# Patient Record
Sex: Male | Born: 1955 | Hispanic: Yes | Marital: Married | Smoking: Former smoker
Health system: Southern US, Community
[De-identification: ages and names within clinical notes are randomized; demographics above are authoritative.]

## PROBLEM LIST (undated history)

## (undated) DIAGNOSIS — I10 Essential (primary) hypertension: Secondary | ICD-10-CM

## (undated) DIAGNOSIS — E119 Type 2 diabetes mellitus without complications: Secondary | ICD-10-CM

## (undated) DIAGNOSIS — E785 Hyperlipidemia, unspecified: Secondary | ICD-10-CM

## (undated) HISTORY — PX: NO PAST SURGERIES: SHX2092

---

## 2012-12-15 ENCOUNTER — Encounter (HOSPITAL_COMMUNITY): Payer: Self-pay | Admitting: *Deleted

## 2012-12-15 ENCOUNTER — Observation Stay (HOSPITAL_COMMUNITY)
Admission: EM | Admit: 2012-12-15 | Discharge: 2012-12-18 | DRG: 184 | Disposition: A | Discharge status: Home / Self Care | Payer: No Typology Code available for payment source | Attending: General Surgery | Admitting: General Surgery

## 2012-12-15 ENCOUNTER — Emergency Department (HOSPITAL_COMMUNITY): Payer: No Typology Code available for payment source

## 2012-12-15 DIAGNOSIS — R000 Tachycardia, unspecified: Secondary | ICD-10-CM

## 2012-12-15 DIAGNOSIS — R079 Chest pain, unspecified: Secondary | ICD-10-CM | POA: Insufficient documentation

## 2012-12-15 DIAGNOSIS — R739 Hyperglycemia, unspecified: Secondary | ICD-10-CM

## 2012-12-15 DIAGNOSIS — I4811 Longstanding persistent atrial fibrillation: Secondary | ICD-10-CM

## 2012-12-15 DIAGNOSIS — E119 Type 2 diabetes mellitus without complications: Secondary | ICD-10-CM | POA: Insufficient documentation

## 2012-12-15 DIAGNOSIS — I4891 Unspecified atrial fibrillation: Secondary | ICD-10-CM

## 2012-12-15 DIAGNOSIS — I4892 Unspecified atrial flutter: Secondary | ICD-10-CM | POA: Insufficient documentation

## 2012-12-15 DIAGNOSIS — S20219A Contusion of unspecified front wall of thorax, initial encounter: Secondary | ICD-10-CM | POA: Insufficient documentation

## 2012-12-15 DIAGNOSIS — R748 Abnormal levels of other serum enzymes: Secondary | ICD-10-CM

## 2012-12-15 DIAGNOSIS — Z79899 Other long term (current) drug therapy: Secondary | ICD-10-CM | POA: Insufficient documentation

## 2012-12-15 DIAGNOSIS — S2220XA Unspecified fracture of sternum, initial encounter for closed fracture: Principal | ICD-10-CM | POA: Insufficient documentation

## 2012-12-15 DIAGNOSIS — W010XXA Fall on same level from slipping, tripping and stumbling without subsequent striking against object, initial encounter: Secondary | ICD-10-CM | POA: Insufficient documentation

## 2012-12-15 LAB — COMPREHENSIVE METABOLIC PANEL
Albumin: 4.3 g/dL (ref 3.5–5.2)
BUN: 21 mg/dL (ref 6–23)
Calcium: 9.3 mg/dL (ref 8.4–10.5)
Creatinine, Ser: 0.79 mg/dL (ref 0.50–1.35)
GFR calc Af Amer: >90 mL/min (ref >90)
Glucose, Bld: 267 mg/dL — ABNORMAL HIGH (ref 70–99)
Total Protein: 7.4 g/dL (ref 6.0–8.3)

## 2012-12-15 LAB — CBC WITH DIFFERENTIAL/PLATELET
Basophils Relative: 0 % (ref 0–1)
Eosinophils Absolute: 0 10*3/uL (ref 0.0–0.7)
Eosinophils Relative: 0 % (ref 0–5)
HCT: 42.5 % (ref 39.0–52.0)
Hemoglobin: 14.5 g/dL (ref 13.0–17.0)
Lymphs Abs: 0.9 10*3/uL (ref 0.7–4.0)
MCH: 28.5 pg (ref 26.0–34.0)
MCHC: 34.1 g/dL (ref 30.0–36.0)
MCV: 83.7 fL (ref 78.0–100.0)
Monocytes Absolute: 0.6 10*3/uL (ref 0.1–1.0)
Monocytes Relative: 5 % (ref 3–12)
Neutrophils Relative %: 89 % — ABNORMAL HIGH (ref 43–77)

## 2012-12-15 LAB — POCT I-STAT TROPONIN I: Troponin i, poc: 0 ng/mL (ref 0.00–0.08)

## 2012-12-15 NOTE — ED Notes (Signed)
MD at bedside. 

## 2012-12-15 NOTE — ED Notes (Signed)
Pt went to urgent care on [address] for CP. Pt was applied to EKG and the EKG at urgent care showed new onset of A-fib-a-flutter. Pt denies that feeling of palpations but states that the medication (he received 2 shots no records from urgent care other than [facility]) and the shots made him feel better. Pt not complaining of CP at this time. Pt denies SOB n/v

## 2012-12-15 NOTE — ED Provider Notes (Signed)
History     CSN: [id number]  Arrival date & time 12/15/12  2009   First MD Initiated Contact with Patient 12/15/12 2228      Chief Complaint  Patient presents with  . Chest Pain    (Consider location/radiation/quality/duration/timing/severity/associated sxs/prior treatment) HPI Comments: 57 y.o. Spanish-speaking male with no PMHx presents today complaining of chest pain s/p trauma. Pt states he was running to get out of the rain, tripped, and fell on a brick sticking up from the walk way, landing on his chest. Pt states the pain was severe and he was having pain on inspiration so he did go to an urgent care for treatment. Urgent care was concerned for what they saw as new onset a-flutter, however an EKG from 2006 shows a-flutter at that time as well. Pt states he was never made aware of the a-flutter, never saw a doctor, and was never medicated for it. Pain today is described as sudden, severe, localized, tender to palpation, worse with inhalation. Pt denies diaphoresis, nausea, vomiting, pain to back/jaw, visual disturbances.   Pt understands English, but Spanish interpreter was used to ensure understanding.   Severity: Moderate  Onset quality: acute s/p trauma Duration: since 11am Timing: Constant  Progression: improving Relieved by: rest Worsened by: inhalation, movement Ineffective treatments: None tried    Patient is a 57 y.o. male presenting with chest pain.  Chest Pain Associated symptoms: no abdominal pain, no cough, no diaphoresis, no dizziness, no fever, no headache, no nausea, no numbness, no palpitations, no shortness of breath, not vomiting and no weakness     History reviewed. No pertinent past medical history.  History reviewed. No pertinent past surgical history.  History reviewed. No pertinent family history.  History  Substance Use Topics  . Smoking status: Never Smoker   . Smokeless tobacco: Not on file  . Alcohol Use: Yes      Review of Systems    Constitutional: Negative for fever and diaphoresis.  HENT: Negative for neck pain and neck stiffness.   Eyes: Negative for visual disturbance.  Respiratory: Negative for apnea, cough, chest tightness and shortness of breath.   Cardiovascular: Positive for chest pain. Negative for palpitations.       Central, localized  Gastrointestinal: Negative for nausea, vomiting, abdominal pain, diarrhea and constipation.  Genitourinary: Negative for dysuria.  Musculoskeletal: Negative for gait problem.  Skin: Negative for rash.  Neurological: Negative for dizziness, weakness, light-headedness, numbness and headaches.    Allergies  Review of patient's allergies indicates no known allergies.  Home Medications  No current outpatient prescriptions on file.  BP 115/63  Pulse 123  Temp(Src) 98.1 F (36.7 C) (Oral)  Resp 19  Ht 5\' 8"  (1.727 m)  Wt 185 lb (83.915 kg)  BMI 28.14 kg/m2  SpO2 99%  Physical Exam  Nursing note and vitals reviewed. Constitutional: He is oriented to person, place, and time. He appears well-developed and well-nourished. No distress.  HENT:  Head: Normocephalic and atraumatic.  Eyes: Conjunctivae and EOM are normal.  Neck: Normal range of motion. Neck supple.  No meningeal signs  Cardiovascular: Normal rate and normal heart sounds.  Exam reveals no gallop and no friction rub.   No murmur heard. Tachycardic to 100s on exam  Pulmonary/Chest: No respiratory distress. He has no wheezes. He has no rales. He exhibits tenderness.  Breath sounds are shallow as pt finds painful to take a deep breath, equal bilateral expansion. Abrasion to central chest noted.   Abdominal: Soft. Bowel  sounds are normal. He exhibits no distension. There is no tenderness. There is no rebound and no guarding.  Musculoskeletal: Normal range of motion. He exhibits no edema and no tenderness.  FROM to upper and lower extremities  Neurological: He is alert and oriented to person, place, and time.  No cranial nerve deficit.  Speech is clear and goal oriented, follows commands Sensation normal to light touch Moves extremities without ataxia, coordination intact Normal gait and balance Normal strength in upper and lower extremities bilaterally including dorsiflexion and plantar flexion, strong and equal grip strength   Skin: Skin is warm and dry. He is not diaphoretic.     Abrasion noted to central chest s/p fall  Psychiatric: He has a normal mood and affect.    ED Course  Procedures (including critical care time)   Date: 12/16/2012  Rate: 110  Rhythm: atrial fibrillation  QRS Axis: left  Intervals: short R-R  ST/T Wave abnormalities: normal  Conduction Disutrbances:none  Narrative Interpretation: abnormal EKG  Old EKG Reviewed: changes noted 02/17/2005 notes a-flutter   Medications  oxyCODONE-acetaminophen (PERCOCET/ROXICET) 5-325 MG per tablet 2 tablet (not administered)  aspirin chewable tablet 324 mg (not administered)    Labs Reviewed  CBC WITH DIFFERENTIAL - Abnormal; Notable for the following:    WBC 13.9 (*)    Neutrophils Relative % 89 (*)    Neutro Abs 12.3 (*)    Lymphocytes Relative 7 (*)    All other components within normal limits  COMPREHENSIVE METABOLIC PANEL - Abnormal; Notable for the following:    Glucose, Bld 267 (*)    AST 89 (*)    ALT 82 (*)    All other components within normal limits  POCT I-STAT TROPONIN I   Dg Chest 2 View  12/15/2012   *RADIOLOGY REPORT*  Clinical Data: Chest pain, fell  CHEST - 2 VIEW  Comparison: None.  Findings: Slightly low inspiratory volumes with subsegmental bibasilar atelectasis.  Mild cardiomegaly.  No pleural effusion or pneumothorax.  On the lateral view, query nondisplaced fracture through the sternal body.  Central bronchitic changes.  IMPRESSION:  1.  Query nondisplaced sternal fracture on the lateral view. 2.  Cardiomegaly 3.  Low inspiratory volumes with mild bibasilar atelectasis 4.  Likely chronic  bronchitic changes.   Original Report Authenticated By: Malachy Moan, M.D.     1. Hyperglycemia   2. Elevated liver enzymes   3. A-fib   4. Tachycardia       MDM  Pt sent over from urgent care without current EKG. EKG that accompanied the pt is form 2006 and shows atrial flutter at that time. Pt is unaware of any underlying cardiac condition and is asymptomatic. Denies feeling of heart racing or previous cardiac incident/pain.  Risk factors for ACS are low: pt is not a smoker, not a drinker, has no known family hx of ACS, not a diabetic, no previous cardiac hx or hx of stroke. Will get labs, troponin, EKG, CXR.  CXR shows possible sternal fx. EKG shows a-fib. Troponin is negative. Will get CT with contrast for clarification and to r/o underlying pathology.  Cardiology consult appreciated to ask starting pt on aspirin vs coumadin. Cardiology agreed to start pt on aspirin and follow up with outpatient given low risk factors and pt.   Labs do show elevated blood glucose. While pt is not dx a diabetic, pt has not had routine medical follow up. Pt also has elevated AST/ALT though asymptomatic. Did discuss these findings with  pt with interpreter on the the phone to ensure pt understood importance of outpatient follow up. Pt and wife at bedside express understanding.   At sign-out, pt understands that he needs to see a cardiologist for treatment of what has become chronic a-fib/a-flutter. Pt understands that he will take aspirin daily until he is seen by cardiology and a treatment plan is in place. Pt understands that he is to fill the prescription for pain meds given to him by the urgent care and take as needed for pain. Pt understands he is waiting for a second set of imaging (the CT) to determine more definitively if his sternum is fractured and to ensure no underlying pathology.   1:17 AM Called CT to find out status. They are coming to transport pt, but there are 3 people ahead of the pt at  this time. Will sign pt out to Dr. Bebe Shaggy. If CT is negative for fx or additional pathology, can discharge as discussed with pt with follow up with cardiology and pain management.   Glade Nurse, [place] 12/16/12 [phone]

## 2012-12-16 ENCOUNTER — Encounter (HOSPITAL_COMMUNITY): Payer: Self-pay | Admitting: Radiology

## 2012-12-16 ENCOUNTER — Emergency Department (HOSPITAL_COMMUNITY): Payer: No Typology Code available for payment source

## 2012-12-16 DIAGNOSIS — E119 Type 2 diabetes mellitus without complications: Secondary | ICD-10-CM

## 2012-12-16 DIAGNOSIS — I4891 Unspecified atrial fibrillation: Secondary | ICD-10-CM

## 2012-12-16 DIAGNOSIS — S2220XA Unspecified fracture of sternum, initial encounter for closed fracture: Secondary | ICD-10-CM

## 2012-12-16 DIAGNOSIS — I4811 Longstanding persistent atrial fibrillation: Secondary | ICD-10-CM

## 2012-12-16 DIAGNOSIS — R7309 Other abnormal glucose: Secondary | ICD-10-CM

## 2012-12-16 LAB — PROTIME-INR: INR: 1.02 (ref 0.00–1.49)

## 2012-12-16 LAB — GLUCOSE, CAPILLARY: Glucose-Capillary: 164 mg/dL — ABNORMAL HIGH (ref 70–99)

## 2012-12-16 LAB — APTT: aPTT: 25 seconds (ref 24–37)

## 2012-12-16 MED ORDER — ENOXAPARIN SODIUM 40 MG/0.4ML ~~LOC~~ SOLN
40.0000 mg | Freq: Every day | SUBCUTANEOUS | Status: DC
  Administered 2012-12-16 – 2012-12-18 (×3): 40 mg via SUBCUTANEOUS
  Filled 2012-12-16 (×3): qty 0.4

## 2012-12-16 MED ORDER — HYDROCODONE-ACETAMINOPHEN 10-325 MG PO TABS: 0.5000 | ORAL_TABLET | ORAL | Status: DC | PRN

## 2012-12-16 MED ORDER — ONDANSETRON HCL 4 MG PO TABS: 4.0000 mg | ORAL_TABLET | Freq: Four times a day (QID) | ORAL | Status: DC | PRN

## 2012-12-16 MED ORDER — ONDANSETRON HCL 4 MG/2ML IJ SOLN: 4.0000 mg | Freq: Four times a day (QID) | INTRAMUSCULAR | Status: DC | PRN

## 2012-12-16 MED ORDER — METOPROLOL TARTRATE 12.5 MG HALF TABLET
12.5000 mg | ORAL_TABLET | Freq: Two times a day (BID) | ORAL | Status: DC
  Administered 2012-12-16 – 2012-12-18 (×4): 12.5 mg via ORAL
  Filled 2012-12-16 (×5): qty 1

## 2012-12-16 MED ORDER — SODIUM CHLORIDE 0.9 % IJ SOLN
3.0000 mL | Freq: Two times a day (BID) | INTRAMUSCULAR | Status: DC
  Administered 2012-12-16 – 2012-12-18 (×4): 3 mL via INTRAVENOUS

## 2012-12-16 MED ORDER — DOCUSATE SODIUM 100 MG PO CAPS
100.0000 mg | ORAL_CAPSULE | Freq: Two times a day (BID) | ORAL | Status: DC
  Administered 2012-12-16 – 2012-12-18 (×5): 100 mg via ORAL
  Filled 2012-12-16 (×6): qty 1

## 2012-12-16 MED ORDER — MORPHINE SULFATE 2 MG/ML IJ SOLN: 2.0000 mg | INTRAMUSCULAR | Status: DC | PRN

## 2012-12-16 MED ORDER — POLYETHYLENE GLYCOL 3350 17 G PO PACK
17.0000 g | PACK | Freq: Every day | ORAL | Status: DC
  Administered 2012-12-16 – 2012-12-18 (×3): 17 g via ORAL
  Filled 2012-12-16 (×3): qty 1

## 2012-12-16 MED ORDER — OXYCODONE-ACETAMINOPHEN 5-325 MG PO TABS
2.0000 | ORAL_TABLET | Freq: Once | ORAL | Status: AC
  Administered 2012-12-16: 2 via ORAL
  Filled 2012-12-16: qty 2

## 2012-12-16 MED ORDER — NAPROXEN 500 MG PO TABS
500.0000 mg | ORAL_TABLET | Freq: Two times a day (BID) | ORAL | Status: DC
  Administered 2012-12-16 – 2012-12-18 (×4): 500 mg via ORAL
  Filled 2012-12-16 (×6): qty 1

## 2012-12-16 MED ORDER — SODIUM CHLORIDE 0.9 % IV SOLN: 250.0000 mL | INTRAVENOUS | Status: DC | PRN

## 2012-12-16 MED ORDER — ASPIRIN 81 MG PO CHEW
324.0000 mg | CHEWABLE_TABLET | Freq: Once | ORAL | Status: AC
  Administered 2012-12-16: 324 mg via ORAL
  Filled 2012-12-16: qty 4

## 2012-12-16 MED ORDER — IOHEXOL 300 MG/ML  SOLN
100.0000 mL | Freq: Once | INTRAMUSCULAR | Status: AC | PRN
  Administered 2012-12-16: 100 mL via INTRAVENOUS

## 2012-12-16 MED ORDER — INSULIN ASPART 100 UNIT/ML ~~LOC~~ SOLN
0.0000 [IU] | Freq: Three times a day (TID) | SUBCUTANEOUS | Status: DC
  Administered 2012-12-17 – 2012-12-18 (×5): 3 [IU] via SUBCUTANEOUS

## 2012-12-16 MED ORDER — SODIUM CHLORIDE 0.9 % IJ SOLN: 3.0000 mL | INTRAMUSCULAR | Status: DC | PRN

## 2012-12-16 NOTE — Consult Note (Signed)
Reason for Consult: Afib Referring Physician:   Xzaiver Vayda is an 57 y.o. male.  HPI:   Patient is a 57 year old male with past medical history of a possible cardiac arrhythmia approximately 7 years ago.  He says he had seen his Dr. for cholesterol at that time and had put him on some sort of medicine.  Patient presented yesterday after he slipped going down some stairs and fell forward landing on his chest and hitting a brick that was protruding from a walkway.  He was noted to be in atrial fibrillation in the emergency room.  His daughter states that his heart rate was elevated approximately 2 weeks ago when she checked his pulse.  She did not notice that was irregular.  He currently complains of some mild chest discomfort and mild left wrist pain.   The patient currently denies nausea, vomiting, fever, shortness of breath, orthopnea, dizziness, PND, cough, congestion, abdominal pain, hematochezia, melena, lower extremity edema, claudication.    Social History:  reports that he has never smoked. He does not have any smokeless tobacco history on file. He reports that  drinks alcohol. He reports that he does not use illicit drugs.  Allergies: No Known Allergies  Medications:  Prior to Admission medications   Not on File     Results for orders placed during the hospital encounter of 12/15/12 (from the past 48 hour(s))  CBC WITH DIFFERENTIAL     Status: Abnormal   Collection Time    12/15/12  8:29 PM      Result Value Range   WBC 13.9 (*) 4.0 - 10.5 K/uL   RBC 5.08  4.22 - 5.81 MIL/uL   Hemoglobin 14.5  13.0 - 17.0 g/dL   HCT 29.5  62.1 - 30.8 %   MCV 83.7  78.0 - 100.0 fL   MCH 28.5  26.0 - 34.0 pg   MCHC 34.1  30.0 - 36.0 g/dL   RDW 65.7  84.6 - 96.2 %   Platelets 221  150 - 400 K/uL   Neutrophils Relative % 89 (*) 43 - 77 %   Neutro Abs 12.3 (*) 1.7 - 7.7 K/uL   Lymphocytes Relative 7 (*) 12 - 46 %   Lymphs Abs 0.9  0.7 - 4.0 K/uL   Monocytes Relative 5  3 - 12 %    Monocytes Absolute 0.6  0.1 - 1.0 K/uL   Eosinophils Relative 0  0 - 5 %   Eosinophils Absolute 0.0  0.0 - 0.7 K/uL   Basophils Relative 0  0 - 1 %   Basophils Absolute 0.0  0.0 - 0.1 K/uL  COMPREHENSIVE METABOLIC PANEL     Status: Abnormal   Collection Time    12/15/12  8:29 PM      Result Value Range   Sodium 138  135 - 145 mEq/L   Potassium 4.8  3.5 - 5.1 mEq/L   Chloride 103  96 - 112 mEq/L   CO2 25  19 - 32 mEq/L   Glucose, Bld 267 (*) 70 - 99 mg/dL   BUN 21  6 - 23 mg/dL   Creatinine, Ser 9.52  0.50 - 1.35 mg/dL   Calcium 9.3  8.4 - 84.1 mg/dL   Total Protein 7.4  6.0 - 8.3 g/dL   Albumin 4.3  3.5 - 5.2 g/dL   AST 89 (*) 0 - 37 U/L   ALT 82 (*) 0 - 53 U/L   Alkaline Phosphatase 98  39 - 117  U/L   Total Bilirubin 0.4  0.3 - 1.2 mg/dL   GFR calc non Af Amer >90  >90 mL/min   GFR calc Af Amer >90  >90 mL/min   Comment:            The eGFR has been calculated     using the CKD EPI equation.     This calculation has not been     validated in all clinical     situations.     eGFR's persistently     <90 mL/min signify     possible Chronic Kidney Disease.  POCT I-STAT TROPONIN I     Status: None   Collection Time    12/15/12  8:34 PM      Result Value Range   Troponin i, poc 0.00  0.00 - 0.08 ng/mL   Comment 3            Comment: Due to the release kinetics of cTnI,     a negative result within the first hours     of the onset of symptoms does not rule out     myocardial infarction with certainty.     If myocardial infarction is still suspected,     repeat the test at appropriate intervals.  PROTIME-INR     Status: None   Collection Time    12/16/12  3:48 AM      Result Value Range   Prothrombin Time 13.3  11.6 - 15.2 seconds   INR 1.02  0.00 - 1.49  APTT     Status: None   Collection Time    12/16/12  3:48 AM      Result Value Range   aPTT 25  24 - 37 seconds    Dg Chest 2 View  12/15/2012   *RADIOLOGY REPORT*  Clinical Data: Chest pain, fell  CHEST - 2 VIEW   Comparison: None.  Findings: Slightly low inspiratory volumes with subsegmental bibasilar atelectasis.  Mild cardiomegaly.  No pleural effusion or pneumothorax.  On the lateral view, query nondisplaced fracture through the sternal body.  Central bronchitic changes.  IMPRESSION:  1.  Query nondisplaced sternal fracture on the lateral view. 2.  Cardiomegaly 3.  Low inspiratory volumes with mild bibasilar atelectasis 4.  Likely chronic bronchitic changes.   Original Report Authenticated By: Malachy Moan, M.D.   Ct Chest W Contrast  12/16/2012   *RADIOLOGY REPORT*  Clinical Data: Chest pain.  Possible sternal fracture.  CT CHEST WITH CONTRAST  Technique:  Multidetector CT imaging of the chest was performed following the standard protocol during bolus administration of intravenous contrast.  Contrast: OMNIPAQUE IOHEXOL 300 MG/ML  SOLN  Comparison: Chest x-ray 12/15/2012.  Findings:  Mediastinum: There is a large amount of high attenuation fluid in the anterior mediastinum and deep to the known sternal fracture, compatible with hematoma.  This exerts mass effect upon the underlying heart and other adjacent structures.  This hematoma measures approximately 10.6 x 3.1 by 9.8 cm in greatest dimensions. No acute abnormality of the thoracic aorta; specifically, no aneurysm or dissection. Heart size is normal. No significant pericardial fluid, thickening or pericardial calcification. Borderline enlarged right paratracheal lymph nodes measuring 1 cm in short axis are nonspecific and may be reactive.  Esophagus is unremarkable in appearance.  Lungs/Pleura: Small bilateral pleural effusions (left greater than right).  Thickening of the peribronchovascular interstitium throughout the lower lobes of the lungs bilaterally (left greater than right) is nonspecific, but favored to represent  sequelae of recent aspiration.  No pneumothorax.  Upper Abdomen: Unremarkable.  Musculoskeletal: Minimally displaced fracture of the  sternum approximately 2 cm beneath the sternomanubrial joint is noted.  Old healed fractures of the left ninth, tenth and eleventh ribs posteriorly are noted. There are no aggressive appearing lytic or blastic lesions noted in the visualized portions of the skeleton.  IMPRESSION: 1.  Minimally displaced sternal fracture with large anterior mediastinal hematoma, as above. 2.  No definite vascular injury in the mediastinum (i.e., the blood does not appear to be related to an acute aortic or major venous injury). 3.  Findings in the lower lobes of the lungs suspicious for aspiration pneumonitis with small bilateral pleural effusions (left greater than right).   Original Report Authenticated By: Trudie Reed, M.D.    Review of Systems  Constitutional: Negative for fever.  HENT: Negative for congestion.   Respiratory: Negative for cough and shortness of breath.   Cardiovascular: Positive for chest pain (Mild). Negative for palpitations, orthopnea, leg swelling and PND.  Gastrointestinal: Negative for nausea, vomiting, abdominal pain, blood in stool and melena.  Genitourinary: Negative for dysuria and hematuria.  Musculoskeletal: Positive for joint pain (Left wrist).  Neurological: Negative for dizziness and weakness.  All other systems reviewed and are negative.   Blood pressure 106/76, pulse 96, temperature 98.5 F (36.9 C), temperature source Oral, resp. rate 18, height 5\' 8"  (1.727 m), weight 178 lb 12.7 oz (81.1 kg), SpO2 99.00%. Physical Exam  Constitutional: He is oriented to person, place, and time. He appears well-developed and well-nourished. No distress.  HENT:  Head: Normocephalic and atraumatic.  Eyes: EOM are normal. Pupils are equal, round, and reactive to light. No scleral icterus.  Neck: Normal range of motion. Neck supple.  Cardiovascular: S1 normal and S2 normal.  An irregularly irregular rhythm present. Tachycardia present.   No murmur heard. Pulses:      Radial pulses are  2+ on the right side, and 2+ on the left side.       Dorsalis pedis pulses are 2+ on the right side, and 2+ on the left side.  Respiratory: Effort normal and breath sounds normal. No respiratory distress. He has no wheezes. He has no rales. He exhibits tenderness.  GI: Soft. Bowel sounds are normal. He exhibits no distension. There is no tenderness.  Musculoskeletal: He exhibits no edema.  Lymphadenopathy:    He has no cervical adenopathy.  Neurological: He is alert and oriented to person, place, and time. He exhibits normal muscle tone.  Skin: Skin is warm and dry.  Psychiatric: He has a normal mood and affect.    Assessment/Plan:    Active Problems:   Atrial fibrillation with rapid ventricular response   Sternal fracture with retrosternal hematoma.  Plan:  Start low dose lopressor and get 2d echo.  Continue ASA.  Hold off on anticoagulation due to trauma.  Will add CBG checks and obtain A1C .  HAGER, BRYAN 12/16/2012, 6:05 PM      I have seen and examined the patient along with Wilburt Finlay, PA.  I have reviewed the chart, notes and new data.  I agree with PA's note.  Key new complaints: feels great - no pain, no palpitations Key examination changes: tiny ecchymosis presternally Key new findings / data: large anterior mediastinal hematoma and nondisplaced sternal fracture on CT chest; no evidence of significant compression of cardiovascular structures or pericardial fluid  PLAN: While it is tempting to attribute his arrhythmia to cardiac contusion,  it sounds like this a older problem. His daughter has noted occasional tachycardia when she checks his BP. He saw a physician 7-8 years ago for an irregular heart beat (Dr. Jonny Ruiz, MD. [address], [place], [place] [address] )     He denies DM, HTN, CHF, CVA/TIA. That would make his CHADS2 score zero, but it appears likely he has DM. ASA would still be the appropriate embolism prevention agent and full  anticoagulation is out of the question until his injury heals.  Check echo. AV blockade for rate control. Aspirin.  Thurmon Fair, MD, [facility] [phone] 12/16/2012, 6:14 PM

## 2012-12-16 NOTE — ED Notes (Signed)
MD at bedside. 

## 2012-12-16 NOTE — Progress Notes (Signed)
Dr. Janee Morn notified of patient's arrival to floor. Donald Chang

## 2012-12-16 NOTE — Progress Notes (Signed)
Patient received from ED. Assessed per flow sheet. Denies chest pain or shortness of breath. VSS. Call bell near.Donald Chang

## 2012-12-16 NOTE — Progress Notes (Signed)
PT Cancellation Note  Patient Details Name: Donald Chang MRN: [id number] DOB: 06-10-1956   Cancelled Treatment:    Reason Eval/Treat Not Completed: Patient not medically ready;Other (comment) (Hold per RN).  Per RN pt has a "big thrombus" I believe this is referring to the sternal fx and large hematoma found on CT scan.  Pt is in afib/flutter and resting HR is 123.  Pt has order for bedrest and activity as tolerated.  Pt will need to be off of bedrest for PT to work with him.  PT to check back tomorrow.     Rollene Rotunda Foy Vanduyne, PT, DPT [phone]   12/16/2012, 2:13 PM

## 2012-12-16 NOTE — ED Provider Notes (Signed)
I spoke to dr Vonna Kotyk wyatt with trauma He saw patient and reviewed CT chest He feels with hematoma as well as aflutter he should be admitted Will admit to trauma service for monitoring for OBS  Joya Gaskins, MD 12/16/12 [phone]

## 2012-12-16 NOTE — ED Notes (Signed)
Family at bedside. 

## 2012-12-16 NOTE — H&P (Signed)
Donald Chang is an 57 y.o. male.   Chief Complaint: The patient came to the ED from Urgent Care center after being seen after a fall with chest pain. HPI: The patient was running in the rain when he fell, hitting his chest on a brick wall, partially bracing himself with his arms, scraping his wrists, but without bony upper extremity pain.  He did not hit his head.  There was no loss of consciousness.  He has a remote history of a cardiac arrhythmia, the details of which the patient was very vague.  He has not received any specific treatment that he recalls.  He does speak [place], but a bit broken, and he seems to have some difficulty understanding some words.  Although he had no LOC, he did have his "wind knocked out".  I was asked to see the patient because of a sternal fracture and a substernal hematoma.  History reviewed. No pertinent past medical history.  History reviewed. No pertinent past surgical history.  History reviewed. No pertinent family history. Social History:  reports that he has never smoked. He does not have any smokeless tobacco history on file. He reports that  drinks alcohol. He reports that he does not use illicit drugs.  Allergies: No Known Allergies  No prescriptions prior to admission    Results for orders placed during the hospital encounter of 12/15/12 (from the past 48 hour(s))  CBC WITH DIFFERENTIAL     Status: Abnormal   Collection Time    12/15/12  8:29 PM      Result Value Range   WBC 13.9 (*) 4.0 - 10.5 K/uL   RBC 5.08  4.22 - 5.81 MIL/uL   Hemoglobin 14.5  13.0 - 17.0 g/dL   HCT 09.8  11.9 - 14.7 %   MCV 83.7  78.0 - 100.0 fL   MCH 28.5  26.0 - 34.0 pg   MCHC 34.1  30.0 - 36.0 g/dL   RDW 82.9  56.2 - 13.0 %   Platelets 221  150 - 400 K/uL   Neutrophils Relative % 89 (*) 43 - 77 %   Neutro Abs 12.3 (*) 1.7 - 7.7 K/uL   Lymphocytes Relative 7 (*) 12 - 46 %   Lymphs Abs 0.9  0.7 - 4.0 K/uL   Monocytes Relative 5  3 - 12 %   Monocytes Absolute  0.6  0.1 - 1.0 K/uL   Eosinophils Relative 0  0 - 5 %   Eosinophils Absolute 0.0  0.0 - 0.7 K/uL   Basophils Relative 0  0 - 1 %   Basophils Absolute 0.0  0.0 - 0.1 K/uL  COMPREHENSIVE METABOLIC PANEL     Status: Abnormal   Collection Time    12/15/12  8:29 PM      Result Value Range   Sodium 138  135 - 145 mEq/L   Potassium 4.8  3.5 - 5.1 mEq/L   Chloride 103  96 - 112 mEq/L   CO2 25  19 - 32 mEq/L   Glucose, Bld 267 (*) 70 - 99 mg/dL   BUN 21  6 - 23 mg/dL   Creatinine, Ser 8.65  0.50 - 1.35 mg/dL   Calcium 9.3  8.4 - 78.4 mg/dL   Total Protein 7.4  6.0 - 8.3 g/dL   Albumin 4.3  3.5 - 5.2 g/dL   AST 89 (*) 0 - 37 U/L   ALT 82 (*) 0 - 53 U/L   Alkaline Phosphatase 98  39 - 117 U/L   Total Bilirubin 0.4  0.3 - 1.2 mg/dL   GFR calc non Af Amer >90  >90 mL/min   GFR calc Af Amer >90  >90 mL/min   Comment:            The eGFR has been calculated     using the CKD EPI equation.     This calculation has not been     validated in all clinical     situations.     eGFR's persistently     <90 mL/min signify     possible Chronic Kidney Disease.  POCT I-STAT TROPONIN I     Status: None   Collection Time    12/15/12  8:34 PM      Result Value Range   Troponin i, poc 0.00  0.00 - 0.08 ng/mL   Comment 3            Comment: Due to the release kinetics of cTnI,     a negative result within the first hours     of the onset of symptoms does not rule out     myocardial infarction with certainty.     If myocardial infarction is still suspected,     repeat the test at appropriate intervals.  PROTIME-INR     Status: None   Collection Time    12/16/12  3:48 AM      Result Value Range   Prothrombin Time 13.3  11.6 - 15.2 seconds   INR 1.02  0.00 - 1.49  APTT     Status: None   Collection Time    12/16/12  3:48 AM      Result Value Range   aPTT 25  24 - 37 seconds   Dg Chest 2 View  12/15/2012   *RADIOLOGY REPORT*  Clinical Data: Chest pain, fell  CHEST - 2 VIEW  Comparison: None.   Findings: Slightly low inspiratory volumes with subsegmental bibasilar atelectasis.  Mild cardiomegaly.  No pleural effusion or pneumothorax.  On the lateral view, query nondisplaced fracture through the sternal body.  Central bronchitic changes.  IMPRESSION:  1.  Query nondisplaced sternal fracture on the lateral view. 2.  Cardiomegaly 3.  Low inspiratory volumes with mild bibasilar atelectasis 4.  Likely chronic bronchitic changes.   Original Report Authenticated By: Malachy Moan, M.D.   Ct Chest W Contrast  12/16/2012   *RADIOLOGY REPORT*  Clinical Data: Chest pain.  Possible sternal fracture.  CT CHEST WITH CONTRAST  Technique:  Multidetector CT imaging of the chest was performed following the standard protocol during bolus administration of intravenous contrast.  Contrast: OMNIPAQUE IOHEXOL 300 MG/ML  SOLN  Comparison: Chest x-ray 12/15/2012.  Findings:  Mediastinum: There is a large amount of high attenuation fluid in the anterior mediastinum and deep to the known sternal fracture, compatible with hematoma.  This exerts mass effect upon the underlying heart and other adjacent structures.  This hematoma measures approximately 10.6 x 3.1 by 9.8 cm in greatest dimensions. No acute abnormality of the thoracic aorta; specifically, no aneurysm or dissection. Heart size is normal. No significant pericardial fluid, thickening or pericardial calcification. Borderline enlarged right paratracheal lymph nodes measuring 1 cm in short axis are nonspecific and may be reactive.  Esophagus is unremarkable in appearance.  Lungs/Pleura: Small bilateral pleural effusions (left greater than right).  Thickening of the peribronchovascular interstitium throughout the lower lobes of the lungs bilaterally (left greater than right) is nonspecific, but favored  to represent sequelae of recent aspiration.  No pneumothorax.  Upper Abdomen: Unremarkable.  Musculoskeletal: Minimally displaced fracture of the sternum approximately  2 cm beneath the sternomanubrial joint is noted.  Old healed fractures of the left ninth, tenth and eleventh ribs posteriorly are noted. There are no aggressive appearing lytic or blastic lesions noted in the visualized portions of the skeleton.  IMPRESSION: 1.  Minimally displaced sternal fracture with large anterior mediastinal hematoma, as above. 2.  No definite vascular injury in the mediastinum (i.e., the blood does not appear to be related to an acute aortic or major venous injury). 3.  Findings in the lower lobes of the lungs suspicious for aspiration pneumonitis with small bilateral pleural effusions (left greater than right).   Original Report Authenticated By: Trudie Reed, M.D.    ROS  Blood pressure 106/76, pulse 96, temperature 98.5 F (36.9 C), temperature source Oral, resp. rate 18, height 5\' 8"  (1.727 m), weight 81.1 kg (178 lb 12.7 oz), SpO2 99.00%. Physical Exam  Constitutional: He is oriented to person, place, and time. He appears well-developed and well-nourished.  HENT:  Head: Normocephalic and atraumatic.  Eyes: Conjunctivae and EOM are normal. Pupils are equal, round, and reactive to light.  Neck: Normal range of motion. Neck supple.  Respiratory: Effort normal and breath sounds normal. He exhibits tenderness and bony tenderness. He exhibits no crepitus and no deformity.    GI: Soft. Bowel sounds are normal. There is no tenderness.  Musculoskeletal: Normal range of motion.  Neurological: He is alert and oriented to person, place, and time. He has normal reflexes.  Skin: Skin is warm and dry.  Psychiatric: He has a normal mood and affect. His behavior is normal. Judgment and thought content normal.     Assessment/Plan Ground level fall in the rain. Mildly displaced mid-body sternal fracture with retrosternal hematoma. Variable atrial flutter/fibrillation with controlled ventricular rate. Question as to whether the atrial arrhythmia is chronic or induced by the fall  and sternal fracture.  Although the patient is minimally symptomatic, I do not feel comfortable sending this patient home with what would seem to be a blunt cardiac injury.  He should be monitored, and cardiology should see the patient in the hospital as opposed to seeing her as an outpatient an aspirin therapy.  No specific treatment planned outside of pain management at this point.  Cherylynn Ridges 12/16/2012, 5:24 PM

## 2012-12-16 NOTE — ED Notes (Signed)
Patient transported to CT 

## 2012-12-17 LAB — GLUCOSE, CAPILLARY
Glucose-Capillary: 185 mg/dL — ABNORMAL HIGH (ref 70–99)
Glucose-Capillary: 188 mg/dL — ABNORMAL HIGH (ref 70–99)

## 2012-12-17 MED ORDER — METFORMIN HCL 500 MG PO TABS
500.0000 mg | ORAL_TABLET | Freq: Two times a day (BID) | ORAL | Status: DC
  Administered 2012-12-17 – 2012-12-18 (×2): 500 mg via ORAL
  Filled 2012-12-17 (×4): qty 1

## 2012-12-17 NOTE — Progress Notes (Addendum)
[facility]  DAILY PROGRESS NOTE   Subjective:  He feels well. Remains in AF, well controlled rate. Echo not yet done  Objective:  Temp:  [98.3 F (36.8 C)-99.1 F (37.3 C)] 98.3 F (36.8 C) (06/21 0456) Pulse Rate:  [78-97] 78 (06/21 0456) Resp:  [17-20] 20 (06/21 0456) BP: (106-123)/(63-86) 115/63 mmHg (06/21 0456) SpO2:  [98 %-99 %] 98 % (06/21 0456) Weight:  [80.7 kg (177 lb 14.6 oz)-81.1 kg (178 lb 12.7 oz)] 81.1 kg (178 lb 12.7 oz) (06/20 0957) Weight change: -3.215 kg (-7 lb 1.4 oz)  Intake/Output from previous day: 06/20 0701 - 06/21 0700 In: 480 [P.O.:480] Out: -   Intake/Output from this shift:    Medications: Current Facility-Administered Medications  Medication Dose Route Frequency Provider Last Rate Last Dose  . 0.9 %  sodium chloride infusion  250 mL Intravenous PRN Freeman Caldron, PA-C      . docusate sodium (COLACE) capsule 100 mg  100 mg Oral BID Freeman Caldron, PA-C   100 mg at 12/16/12 2144  . enoxaparin (LOVENOX) injection 40 mg  40 mg Subcutaneous Daily Freeman Caldron, PA-C   40 mg at 12/16/12 1208  . HYDROcodone-acetaminophen (NORCO) 10-325 MG per tablet 0.5-2 tablet  0.5-2 tablet Oral Q4H PRN Freeman Caldron, PA-C      . insulin aspart (novoLOG) injection 0-15 Units  0-15 Units Subcutaneous TID WC Wilburt Finlay, PA-C   3 Units at 12/17/12 0650  . metoprolol tartrate (LOPRESSOR) tablet 12.5 mg  12.5 mg Oral BID Wilburt Finlay, PA-C   12.5 mg at 12/16/12 1900  . morphine 2 MG/ML injection 2 mg  2 mg Intravenous Q4H PRN Freeman Caldron, PA-C      . naproxen (NAPROSYN) tablet 500 mg  500 mg Oral BID WC Freeman Caldron, PA-C   500 mg at 12/17/12 0800  . ondansetron (ZOFRAN) tablet 4 mg  4 mg Oral Q6H PRN Freeman Caldron, PA-C       Or  . ondansetron [facility]) injection 4 mg  4 mg Intravenous Q6H PRN Freeman Caldron, PA-C      . polyethylene glycol (MIRALAX / GLYCOLAX) packet 17 g  17 g Oral Daily Freeman Caldron, PA-C   17 g at 12/16/12 1209  . sodium chloride 0.9 % injection 3 mL  3 mL Intravenous Q12H Freeman Caldron, PA-C   3 mL at 12/16/12 2144  . sodium chloride 0.9 % injection 3 mL  3 mL Intravenous PRN Freeman Caldron, PA-C        Physical Exam: Constitutional: He is oriented to person, place, and time. He appears well-developed and well-nourished. No distress.  HENT:  Head: Normocephalic and atraumatic.  Eyes: EOM are normal. Pupils are equal, round, and reactive to light. No scleral icterus.  Neck: Normal range of motion. Neck supple.  Cardiovascular: S1 normal and S2 normal. An irregularly irregular rhythm present. Tachycardia present.  No murmur heard.  Pulses:  Radial pulses are 2+ on the right side, and 2+ on the left side.  Dorsalis pedis pulses are 2+ on the right side, and 2+ on the left side.  Respiratory: Effort normal and breath sounds normal. No respiratory distress. He has no wheezes. He has no rales. He exhibits tenderness.  GI: Soft. Bowel sounds are normal. He exhibits no distension. There is no tenderness.  Musculoskeletal: He exhibits no edema.  Lymphadenopathy:  He has no cervical adenopathy.  Neurological:  He is alert and oriented to person, place, and time. He exhibits normal muscle tone.  Skin: Skin is warm and dry.  Psychiatric: He has a normal mood and affect.       Lab Results: Results for orders placed during the hospital encounter of 12/15/12 (from the past 48 hour(s))  CBC WITH DIFFERENTIAL     Status: Abnormal   Collection Time    12/15/12  8:29 PM      Result Value Range   WBC 13.9 (*) 4.0 - 10.5 K/uL   RBC 5.08  4.22 - 5.81 MIL/uL   Hemoglobin 14.5  13.0 - 17.0 g/dL   HCT 52.8  41.3 - 24.4 %   MCV 83.7  78.0 - 100.0 fL   MCH 28.5  26.0 - 34.0 pg   MCHC 34.1  30.0 - 36.0 g/dL   RDW 01.0  27.2 - 53.6 %   Platelets 221  150 - 400 K/uL   Neutrophils Relative % 89 (*) 43 - 77 %   Neutro Abs 12.3 (*) 1.7 - 7.7 K/uL   Lymphocytes  Relative 7 (*) 12 - 46 %   Lymphs Abs 0.9  0.7 - 4.0 K/uL   Monocytes Relative 5  3 - 12 %   Monocytes Absolute 0.6  0.1 - 1.0 K/uL   Eosinophils Relative 0  0 - 5 %   Eosinophils Absolute 0.0  0.0 - 0.7 K/uL   Basophils Relative 0  0 - 1 %   Basophils Absolute 0.0  0.0 - 0.1 K/uL  COMPREHENSIVE METABOLIC PANEL     Status: Abnormal   Collection Time    12/15/12  8:29 PM      Result Value Range   Sodium 138  135 - 145 mEq/L   Potassium 4.8  3.5 - 5.1 mEq/L   Chloride 103  96 - 112 mEq/L   CO2 25  19 - 32 mEq/L   Glucose, Bld 267 (*) 70 - 99 mg/dL   BUN 21  6 - 23 mg/dL   Creatinine, Ser 6.44  0.50 - 1.35 mg/dL   Calcium 9.3  8.4 - 03.4 mg/dL   Total Protein 7.4  6.0 - 8.3 g/dL   Albumin 4.3  3.5 - 5.2 g/dL   AST 89 (*) 0 - 37 U/L   ALT 82 (*) 0 - 53 U/L   Alkaline Phosphatase 98  39 - 117 U/L   Total Bilirubin 0.4  0.3 - 1.2 mg/dL   GFR calc non Af Amer >90  >90 mL/min   GFR calc Af Amer >90  >90 mL/min   Comment:            The eGFR has been calculated     using the CKD EPI equation.     This calculation has not been     validated in all clinical     situations.     eGFR's persistently     <90 mL/min signify     possible Chronic Kidney Disease.  POCT I-STAT TROPONIN I     Status: None   Collection Time    12/15/12  8:34 PM      Result Value Range   Troponin i, poc 0.00  0.00 - 0.08 ng/mL   Comment 3            Comment: Due to the release kinetics of cTnI,     a negative result within the first hours     of the onset  of symptoms does not rule out     myocardial infarction with certainty.     If myocardial infarction is still suspected,     repeat the test at appropriate intervals.  PROTIME-INR     Status: None   Collection Time    12/16/12  3:48 AM      Result Value Range   Prothrombin Time 13.3  11.6 - 15.2 seconds   INR 1.02  0.00 - 1.49  APTT     Status: None   Collection Time    12/16/12  3:48 AM      Result Value Range   aPTT 25  24 - 37 seconds   HEMOGLOBIN A1C     Status: Abnormal   Collection Time    12/16/12  7:46 PM      Result Value Range   Hemoglobin A1C 8.2 (*) <5.7 %   Comment: (NOTE)                                                                               According to the ADA Clinical Practice Recommendations for 2011, when     HbA1c is used as a screening test:      >=6.5%   Diagnostic of Diabetes Mellitus               (if abnormal result is confirmed)     5.7-6.4%   Increased risk of developing Diabetes Mellitus     References:Diagnosis and Classification of Diabetes Mellitus,Diabetes     Care,2011,34(Suppl 1):S62-S69 and Standards of Medical Care in             Diabetes - 2011,Diabetes Care,2011,34 (Suppl 1):S11-S61.   Mean Plasma Glucose 189 (*) <117 mg/dL  GLUCOSE, CAPILLARY     Status: Abnormal   Collection Time    12/16/12  8:43 PM      Result Value Range   Glucose-Capillary 164 (*) 70 - 99 mg/dL  GLUCOSE, CAPILLARY     Status: Abnormal   Collection Time    12/17/12  6:14 AM      Result Value Range   Glucose-Capillary 185 (*) 70 - 99 mg/dL    Imaging: Dg Chest 2 View  12/15/2012   *RADIOLOGY REPORT*  Clinical Data: Chest pain, fell  CHEST - 2 VIEW  Comparison: None.  Findings: Slightly low inspiratory volumes with subsegmental bibasilar atelectasis.  Mild cardiomegaly.  No pleural effusion or pneumothorax.  On the lateral view, query nondisplaced fracture through the sternal body.  Central bronchitic changes.  IMPRESSION:  1.  Query nondisplaced sternal fracture on the lateral view. 2.  Cardiomegaly 3.  Low inspiratory volumes with mild bibasilar atelectasis 4.  Likely chronic bronchitic changes.   Original Report Authenticated By: Malachy Moan, M.D.   Ct Chest W Contrast  12/16/2012   *RADIOLOGY REPORT*  Clinical Data: Chest pain.  Possible sternal fracture.  CT CHEST WITH CONTRAST  Technique:  Multidetector CT imaging of the chest was performed following the standard protocol during bolus  administration of intravenous contrast.  Contrast: OMNIPAQUE IOHEXOL 300 MG/ML  SOLN  Comparison: Chest x-ray 12/15/2012.  Findings:  Mediastinum: There is a large amount of high attenuation fluid  in the anterior mediastinum and deep to the known sternal fracture, compatible with hematoma.  This exerts mass effect upon the underlying heart and other adjacent structures.  This hematoma measures approximately 10.6 x 3.1 by 9.8 cm in greatest dimensions. No acute abnormality of the thoracic aorta; specifically, no aneurysm or dissection. Heart size is normal. No significant pericardial fluid, thickening or pericardial calcification. Borderline enlarged right paratracheal lymph nodes measuring 1 cm in short axis are nonspecific and may be reactive.  Esophagus is unremarkable in appearance.  Lungs/Pleura: Small bilateral pleural effusions (left greater than right).  Thickening of the peribronchovascular interstitium throughout the lower lobes of the lungs bilaterally (left greater than right) is nonspecific, but favored to represent sequelae of recent aspiration.  No pneumothorax.  Upper Abdomen: Unremarkable.  Musculoskeletal: Minimally displaced fracture of the sternum approximately 2 cm beneath the sternomanubrial joint is noted.  Old healed fractures of the left ninth, tenth and eleventh ribs posteriorly are noted. There are no aggressive appearing lytic or blastic lesions noted in the visualized portions of the skeleton.  IMPRESSION: 1.  Minimally displaced sternal fracture with large anterior mediastinal hematoma, as above. 2.  No definite vascular injury in the mediastinum (i.e., the blood does not appear to be related to an acute aortic or major venous injury). 3.  Findings in the lower lobes of the lungs suspicious for aspiration pneumonitis with small bilateral pleural effusions (left greater than right).   Original Report Authenticated By: Trudie Reed, M.D.    Assessment:  1. Active  Problems: 2.   Atrial fibrillation with rapid ventricular response 3.   Plan:  1. Aspirin 2. Review echo 3. Good rate control without meds increases suspicion that he has chronic conduction system disease and that this may be permanent atrial fibrillation, unrelated to chest trauma. 4. Initiate metformin for DM, will arrange outpatient diabetes education/nutrition consult, but he needs a primary care physician  Time Spent Directly with Patient:  20 minutes  Length of Stay:  LOS: 2 days    Eder Macek 12/17/2012, 9:34 AM

## 2012-12-17 NOTE — Progress Notes (Signed)
OT Cancellation/screen Note  Patient Details Name: [name] MRN: [id number] DOB: Dec 03, 1955   Cancelled Treatment:    Reason Eval/Treat Not Completed: Other (comment) Checked with pt.  He feels like he will be independent with adls and bathroom transfers.  Ambulated with PT earlier at independent level.  Has family to assist if needed.    Trinisha Paget 12/17/2012, 4:18 PM Marica Otter, OTR/L [phone] 12/17/2012

## 2012-12-17 NOTE — Progress Notes (Signed)
Subjective: No c/o. Denies CP, sob, n/v  Objective: Vital signs in last 24 hours: Temp:  [98.3 F (36.8 C)-99.1 F (37.3 C)] 98.3 F (36.8 C) (06/21 0456) Pulse Rate:  [78-97] 78 (06/21 0456) Resp:  [18-20] 20 (06/21 0456) BP: (106-123)/(63-84) 115/63 mmHg (06/21 0456) SpO2:  [98 %-99 %] 98 % (06/21 0456) Last BM Date: 12/15/12  Intake/Output from previous day: 06/20 0701 - 06/21 0700 In: 480 [P.O.:480] Out: -  Intake/Output this shift:    Alert, nad cta Soft, nt, nd MAe, nonfocal  Lab Results:   Recent Labs  12/15/12 2029  WBC 13.9*  HGB 14.5  HCT 42.5  PLT 221   BMET  Recent Labs  12/15/12 2029  NA 138  K 4.8  CL 103  CO2 25  GLUCOSE 267*  BUN 21  CREATININE 0.79  CALCIUM 9.3   PT/INR  Recent Labs  12/16/12 0348  LABPROT 13.3  INR 1.02   ABG No results found for this basename: PHART, PCO2, PO2, HCO3,  in the last 72 hours  Studies/Results: Dg Chest 2 View  12/15/2012   *RADIOLOGY REPORT*  Clinical Data: Chest pain, fell  CHEST - 2 VIEW  Comparison: None.  Findings: Slightly low inspiratory volumes with subsegmental bibasilar atelectasis.  Mild cardiomegaly.  No pleural effusion or pneumothorax.  On the lateral view, query nondisplaced fracture through the sternal body.  Central bronchitic changes.  IMPRESSION:  1.  Query nondisplaced sternal fracture on the lateral view. 2.  Cardiomegaly 3.  Low inspiratory volumes with mild bibasilar atelectasis 4.  Likely chronic bronchitic changes.   Original Report Authenticated By: Malachy Moan, M.D.   Ct Chest W Contrast  12/16/2012   *RADIOLOGY REPORT*  Clinical Data: Chest pain.  Possible sternal fracture.  CT CHEST WITH CONTRAST  Technique:  Multidetector CT imaging of the chest was performed following the standard protocol during bolus administration of intravenous contrast.  Contrast: OMNIPAQUE IOHEXOL 300 MG/ML  SOLN  Comparison: Chest x-ray 12/15/2012.  Findings:  Mediastinum: There is a  large amount of high attenuation fluid in the anterior mediastinum and deep to the known sternal fracture, compatible with hematoma.  This exerts mass effect upon the underlying heart and other adjacent structures.  This hematoma measures approximately 10.6 x 3.1 by 9.8 cm in greatest dimensions. No acute abnormality of the thoracic aorta; specifically, no aneurysm or dissection. Heart size is normal. No significant pericardial fluid, thickening or pericardial calcification. Borderline enlarged right paratracheal lymph nodes measuring 1 cm in short axis are nonspecific and may be reactive.  Esophagus is unremarkable in appearance.  Lungs/Pleura: Small bilateral pleural effusions (left greater than right).  Thickening of the peribronchovascular interstitium throughout the lower lobes of the lungs bilaterally (left greater than right) is nonspecific, but favored to represent sequelae of recent aspiration.  No pneumothorax.  Upper Abdomen: Unremarkable.  Musculoskeletal: Minimally displaced fracture of the sternum approximately 2 cm beneath the sternomanubrial joint is noted.  Old healed fractures of the left ninth, tenth and eleventh ribs posteriorly are noted. There are no aggressive appearing lytic or blastic lesions noted in the visualized portions of the skeleton.  IMPRESSION: 1.  Minimally displaced sternal fracture with large anterior mediastinal hematoma, as above. 2.  No definite vascular injury in the mediastinum (i.e., the blood does not appear to be related to an acute aortic or major venous injury). 3.  Findings in the lower lobes of the lungs suspicious for aspiration pneumonitis with small bilateral pleural effusions (  left greater than right).   Original Report Authenticated By: Trudie Reed, M.D.    Anti-infectives: Anti-infectives   None      Assessment/Plan: Fall Sternal fx with hematoma A fib DM  Ok for discharge from trauma standpoint however Awaiting echo (cards ordered) bld  sugars ok  Donald Chang. Andrey Campanile, MD, FACS General, Bariatric, & Minimally Invasive Surgery [facility], [place]   LOS: 2 days    Donald Chang 12/17/2012

## 2012-12-17 NOTE — ED Provider Notes (Signed)
Medical screening examination/treatment/procedure(s) were performed by non-physician practitioner and as supervising physician I was immediately available for consultation/collaboration.  Lacy Sofia L Boby Eyer, MD 12/17/12 0734 

## 2012-12-17 NOTE — Evaluation (Signed)
Physical Therapy Evaluation Patient Details Name: Donald Chang MRN: [id number] DOB: [birth year] Today's Date: 12/17/2012 Time: 9562-1308 PT Time Calculation (min): 18 min  PT Assessment / Plan / Recommendation Clinical Impression    The patient was running in the rain when he fell, hitting his chest on a brick wall, partially bracing himself with his arms, scraping his wrists, but without bony upper extremity pain. He did not hit his head. There was no loss of consciousness. He has a remote history of a cardiac arrhythmia.  Upon evaluation, patient is independent and demonstrates no significant deficits in gait or mobility. HR did increase to 130s with activity but patient reports no pain or difficulty breath. At this time do not feel patient demonstrates any acute PT needs. PT will sign off.     PT Assessment  Patent does not need any further PT services    Follow Up Recommendations  No PT follow up    Does the patient have the potential to tolerate intense rehabilitation      Barriers to Discharge        Equipment Recommendations  None recommended by PT    Recommendations for Other Services     Frequency      Precautions / Restrictions Precautions Precautions: None Restrictions Weight Bearing Restrictions: No   Pertinent Vitals/Pain No pain, HR 130s with ambulation      Mobility  Bed Mobility Bed Mobility: Supine to Sit;Sitting - Scoot to Edge of Bed Supine to Sit: 7: Independent Sitting - Scoot to [place] of Bed: 7: Independent Transfers Transfers: Sit to Stand;Stand to Sit Sit to Stand: 7: Independent Stand to Sit: 7: Independent Ambulation/Gait Ambulation/Gait Assistance: 7: Independent Ambulation Distance (Feet): 410 Feet Assistive device: None Ambulation/Gait Assistance Details: no assist required Gait Pattern: Within Functional Limits Gait velocity: WFL for community amb General Gait Details: steady Stairs: Yes Stairs Assistance: 7: Independent Stair  Management Technique: No rails Number of Stairs: 12       Visit Information  Last PT Received On: 12/17/12 Assistance Needed: +1    Subjective Data  Subjective: I feel good Patient Stated Goal: to go home   Prior Functioning  Home Living Lives With: Family Available Help at Discharge: Family Type of Home: House Home Access: Stairs to enter Secretary/administrator of Steps: 3 Home Layout: One level Bathroom Shower/Tub: Engineer, manufacturing systems: Standard Home Adaptive Equipment: None Prior Function Level of Independence: Independent Able to Take Stairs?: Yes Driving: Yes Communication Communication: Prefers language other than English Dominant Hand: Right    Cognition  Cognition Arousal/Alertness: Awake/alert Behavior During Therapy: WFL for tasks assessed/performed Overall Cognitive Status: Within Functional Limits for tasks assessed    Extremity/Trunk Assessment Right Upper Extremity Assessment RUE ROM/Strength/Tone: Within functional levels Left Upper Extremity Assessment LUE ROM/Strength/Tone: Within functional levels Right Lower Extremity Assessment RLE ROM/Strength/Tone: Within functional levels RLE Sensation: WFL - Light Touch;WFL - Proprioception RLE Coordination: WFL - gross/fine motor Left Lower Extremity Assessment LLE ROM/Strength/Tone: WFL for tasks assessed LLE Sensation: WFL - Light Touch;WFL - Proprioception LLE Coordination: WFL - gross/fine motor Trunk Assessment Trunk Assessment: Normal   Balance Balance Balance Assessed: Yes High Level Balance High Level Balance Activites: Side stepping;Backward walking;Direction changes;Turns;Sudden stops;Head turns High Level Balance Comments: steady and independent  End of Session PT - End of Session Equipment Utilized During Treatment: Gait belt Activity Tolerance: Patient tolerated treatment well Patient left: in chair;with call bell/phone within reach Nurse Communication: Mobility status   GP  Fabio Asa 12/17/2012, 1:21 PM Charlotte Crumb, PT DPT  [phone]

## 2012-12-18 DIAGNOSIS — I4891 Unspecified atrial fibrillation: Secondary | ICD-10-CM

## 2012-12-18 LAB — GLUCOSE, CAPILLARY
Glucose-Capillary: 164 mg/dL — ABNORMAL HIGH (ref 70–99)
Glucose-Capillary: 153 mg/dL — ABNORMAL HIGH (ref 70–99)

## 2012-12-18 MED ORDER — NAPROXEN 500 MG PO TABS: 500.0000 mg | ORAL_TABLET | Freq: Two times a day (BID) | ORAL | Status: DC

## 2012-12-18 MED ORDER — METFORMIN HCL 500 MG PO TABS: 500.0000 mg | ORAL_TABLET | Freq: Two times a day (BID) | ORAL | Status: DC

## 2012-12-18 MED ORDER — HYDROCODONE-ACETAMINOPHEN 10-325 MG PO TABS: 1.0000 | ORAL_TABLET | ORAL | Status: DC | PRN

## 2012-12-18 MED ORDER — METOPROLOL TARTRATE 12.5 MG HALF TABLET: 12.5000 mg | ORAL_TABLET | Freq: Two times a day (BID) | ORAL | Status: DC

## 2012-12-18 MED ORDER — POLYETHYLENE GLYCOL 3350 17 G PO PACK: 17.0000 g | PACK | Freq: Every day | ORAL | Status: AC

## 2012-12-18 NOTE — Discharge Summary (Signed)
Physician Discharge Summary  [facility] ZOX:[id number] DOB: 1956-04-14 DOA: 12/15/2012  PCP: No PCP Per Patient  Consultation: Dr. Thurmon Fair( cardiology)  Admit date: 12/15/2012 Discharge date: 12/18/2012  Recommendations for Outpatient Follow-up:   Follow-up Information   Schedule an appointment as soon as possible for a visit with [facility] [place].   Contact information:   [address] [place] [address] [phone]      Follow up with [facility] [place]. (As needed)    Contact information:   [address] [place] [address] [phone]      Discharge Diagnoses:  1. Fall 2. Mildly discplaced sternal fracture with retrosternal hematoma 3. Atrial flutter 4. New onset diabetes mellitus   Surgical Procedure: none  Discharge Condition: stable Disposition: home  Diet recommendation: carb modified  Filed Weights   12/15/12 2023 12/16/12 0935 12/16/12 0957  Weight: 185 lb (83.915 kg) 177 lb 14.6 oz (80.7 kg) 178 lb 12.7 oz (81.1 kg)    Filed Vitals:   12/18/12 0415  BP: 122/87  Pulse: 95  Temp: 98.6 F (37 C)  Resp: 20   Hospital Course:  Donald Chang was admitted to [facility] following a fall.  His work up showed sternal fracture.  He was also found to be in atrial flutter.  Cardiology was consulted.  He was started on metoprolol and aspirin.  An echocardiogram was completed on the day of discharge, he will follow up with cardiology as an outpatient.  He was started on metformin for A1c of 8.2 which he claims is a new diagnosis. We had a lengthy discussion regarding follow up with primary care, diet and exercise.  His pain was under adequate control with norco and nsaids.  His vital signs remained stable.  His laboratory evaluation was unremarkable with respect to A1c, he was kept on a sliding scale and QID glucose monitoring while in the hospital, he will need monitoring kit and further  follow up for this.  He may follow up with [place] as needed.  Discharge Instructions     Medication List    TAKE these medications       HYDROcodone-acetaminophen 10-325 MG per tablet  Commonly known as:  NORCO  Take 1 tablet by mouth every 4 (four) hours as needed (1/2 tablet for mild pain, 1 tablet for moderate pain, 2 tablets for severe pain).     metFORMIN 500 MG tablet  Commonly known as:  GLUCOPHAGE  Take 1 tablet (500 mg total) by mouth 2 (two) times daily with a meal.     metoprolol tartrate 12.5 mg Tabs  Commonly known as:  LOPRESSOR  Take 0.5 tablets (12.5 mg total) by mouth 2 (two) times daily.     naproxen 500 MG tablet  Commonly known as:  NAPROSYN  Take 1 tablet (500 mg total) by mouth 2 (two) times daily with a meal.     polyethylene glycol packet  Commonly known as:  MIRALAX / GLYCOLAX  Take 17 g by mouth daily.           Follow-up Information   Schedule an appointment as soon as possible for a visit with [facility] [place].   Contact information:   [address] [place] [address] [phone]      Follow up with [facility] [place]. (As needed)    Contact information:   [address] [place]  [address] [phone]        The results of significant diagnostics from this hospitalization (including imaging, microbiology, ancillary and laboratory) are listed below for reference.    Significant Diagnostic Studies: Dg Chest 2 View  12/15/2012   *RADIOLOGY REPORT*  Clinical Data: Chest pain, fell  CHEST - 2 VIEW  Comparison: None.  Findings: Slightly low inspiratory volumes with subsegmental bibasilar atelectasis.  Mild cardiomegaly.  No pleural effusion or pneumothorax.  On the lateral view, query nondisplaced fracture through the sternal body.  Central bronchitic changes.  IMPRESSION:  1.  Query nondisplaced sternal fracture on the lateral view. 2.  Cardiomegaly 3.  Low inspiratory volumes  with mild bibasilar atelectasis 4.  Likely chronic bronchitic changes.   Original Report Authenticated By: Malachy Moan, M.D.   Ct Chest W Contrast  12/16/2012   *RADIOLOGY REPORT*  Clinical Data: Chest pain.  Possible sternal fracture.  CT CHEST WITH CONTRAST  Technique:  Multidetector CT imaging of the chest was performed following the standard protocol during bolus administration of intravenous contrast.  Contrast: OMNIPAQUE IOHEXOL 300 MG/ML  SOLN  Comparison: Chest x-ray 12/15/2012.  Findings:  Mediastinum: There is a large amount of high attenuation fluid in the anterior mediastinum and deep to the known sternal fracture, compatible with hematoma.  This exerts mass effect upon the underlying heart and other adjacent structures.  This hematoma measures approximately 10.6 x 3.1 by 9.8 cm in greatest dimensions. No acute abnormality of the thoracic aorta; specifically, no aneurysm or dissection. Heart size is normal. No significant pericardial fluid, thickening or pericardial calcification. Borderline enlarged right paratracheal lymph nodes measuring 1 cm in short axis are nonspecific and may be reactive.  Esophagus is unremarkable in appearance.  Lungs/Pleura: Small bilateral pleural effusions (left greater than right).  Thickening of the peribronchovascular interstitium throughout the lower lobes of the lungs bilaterally (left greater than right) is nonspecific, but favored to represent sequelae of recent aspiration.  No pneumothorax.  Upper Abdomen: Unremarkable.  Musculoskeletal: Minimally displaced fracture of the sternum approximately 2 cm beneath the sternomanubrial joint is noted.  Old healed fractures of the left ninth, tenth and eleventh ribs posteriorly are noted. There are no aggressive appearing lytic or blastic lesions noted in the visualized portions of the skeleton.  IMPRESSION: 1.  Minimally displaced sternal fracture with large anterior mediastinal hematoma, as above. 2.  No  definite vascular injury in the mediastinum (i.e., the blood does not appear to be related to an acute aortic or major venous injury). 3.  Findings in the lower lobes of the lungs suspicious for aspiration pneumonitis with small bilateral pleural effusions (left greater than right).   Original Report Authenticated By: Trudie Reed, M.D.    Microbiology: No results found for this or any previous visit (from the past 240 hour(s)).   Labs: Basic Metabolic Panel:  Recent Labs Lab 12/15/12 2029  NA 138  K 4.8  CL 103  CO2 25  GLUCOSE 267*  BUN 21  CREATININE 0.79  CALCIUM 9.3   Liver Function Tests:  Recent Labs Lab 12/15/12 2029  AST 89*  ALT 82*  ALKPHOS 98  BILITOT 0.4  PROT 7.4  ALBUMIN 4.3   No results found for this basename: LIPASE, AMYLASE,  in the last 168 hours No results found for this basename: AMMONIA,  in the last 168 hours CBC:  Recent Labs Lab 12/15/12 2029  WBC 13.9*  NEUTROABS 12.3*  HGB 14.5  HCT 42.5  MCV 83.7  PLT 221   Cardiac Enzymes: No results found for this basename: CKTOTAL, CKMB, CKMBINDEX, TROPONINI,  in the last 168 hours BNP: BNP (last 3 results) No results found for this basename: PROBNP,  in the last 8760 hours CBG:  Recent Labs Lab 12/17/12 1117 12/17/12 1614 12/17/12 2032 12/18/12 0601 12/18/12 1108  GLUCAP 188* 171* 156* 164* 153*    Active Problems:   Atrial fibrillation with rapid ventricular response   Time coordinating discharge: 30 mins  Signed:  Lalah Durango, ANP-BC

## 2012-12-18 NOTE — Progress Notes (Signed)
  Subjective: Pt with no c/o.  Echo not yet done.  Objective: Vital signs in last 24 hours: Temp:  [98.1 F (36.7 C)-98.6 F (37 C)] 98.6 F (37 C) (06/22 0415) Pulse Rate:  [89-106] 95 (06/22 0415) Resp:  [18-20] 20 (06/22 0415) BP: (98-144)/(65-98) 122/87 mmHg (06/22 0415) SpO2:  [98 %-100 %] 98 % (06/22 0415) Last BM Date: 12/15/12  Intake/Output from previous day:   Intake/Output this shift:    General appearance: alert and cooperative Chest wall: no tenderness, approp ttp  Lab Results:   Recent Labs  12/15/12 2029  WBC 13.9*  HGB 14.5  HCT 42.5  PLT 221   BMET  Recent Labs  12/15/12 2029  NA 138  K 4.8  CL 103  CO2 25  GLUCOSE 267*  BUN 21  CREATININE 0.79  CALCIUM 9.3   PT/INR  Recent Labs  12/16/12 0348  LABPROT 13.3  INR 1.02   ABG No results found for this basename: PHART, PCO2, PO2, HCO3,  in the last 72 hours  Studies/Results: No results found.  Anti-infectives: Anti-infectives   None      Assessment/Plan: s/p * No surgery found * Doing well with no c/o From trauma standpoint pt OK for DC. If OK with Cards, perhaps Echo can be done as outpt, will await their decision. If outpt Echo OK'd will DC today  LOS: 3 days    Marigene Ehlers., [facility] 12/18/2012

## 2012-12-18 NOTE — Progress Notes (Signed)
Subjective:  No CP/SOB  Objective:  Temp:  [98.1 F (36.7 C)-98.6 F (37 C)] 98.6 F (37 C) (06/22 0415) Pulse Rate:  [89-106] 95 (06/22 0415) Resp:  [18-20] 20 (06/22 0415) BP: (98-144)/(65-98) 122/87 mmHg (06/22 0415) SpO2:  [98 %-100 %] 98 % (06/22 0415) Weight change:   Intake/Output from previous day:    Intake/Output from this shift:    Physical Exam: General appearance: alert and no distress Neck: no adenopathy, no carotid bruit, no JVD, supple, symmetrical, trachea midline and thyroid not enlarged, symmetric, no tenderness/mass/nodules Lungs: clear to auscultation bilaterally Heart: irregularly irregular rhythm Extremities: extremities normal, atraumatic, no cyanosis or edema  Lab Results: Results for orders placed during the hospital encounter of 12/15/12 (from the past 48 hour(s))  HEMOGLOBIN A1C     Status: Abnormal   Collection Time    12/16/12  7:46 PM      Result Value Range   Hemoglobin A1C 8.2 (*) <5.7 %   Comment: (NOTE)                                                                               According to the ADA Clinical Practice Recommendations for 2011, when     HbA1c is used as a screening test:      >=6.5%   Diagnostic of Diabetes Mellitus               (if abnormal result is confirmed)     5.7-6.4%   Increased risk of developing Diabetes Mellitus     References:Diagnosis and Classification of Diabetes Mellitus,Diabetes     Care,2011,34(Suppl 1):S62-S69 and Standards of Medical Care in             Diabetes - 2011,Diabetes Care,2011,34 (Suppl 1):S11-S61.   Mean Plasma Glucose 189 (*) <117 mg/dL  GLUCOSE, CAPILLARY     Status: Abnormal   Collection Time    12/16/12  8:43 PM      Result Value Range   Glucose-Capillary 164 (*) 70 - 99 mg/dL  GLUCOSE, CAPILLARY     Status: Abnormal   Collection Time    12/17/12  6:14 AM      Result Value Range   Glucose-Capillary 185 (*) 70 - 99 mg/dL  GLUCOSE, CAPILLARY     Status: Abnormal   Collection Time    12/17/12 11:17 AM      Result Value Range   Glucose-Capillary 188 (*) 70 - 99 mg/dL   Comment 1 Notify RN     Comment 2 Documented in Chart    GLUCOSE, CAPILLARY     Status: Abnormal   Collection Time    12/17/12  4:14 PM      Result Value Range   Glucose-Capillary 171 (*) 70 - 99 mg/dL   Comment 1 Notify RN     Comment 2 Documented in Chart    GLUCOSE, CAPILLARY     Status: Abnormal   Collection Time    12/17/12  8:32 PM      Result Value Range   Glucose-Capillary 156 (*) 70 - 99 mg/dL  GLUCOSE, CAPILLARY     Status: Abnormal   Collection Time    12/18/12  6:01  AM      Result Value Range   Glucose-Capillary 164 (*) 70 - 99 mg/dL    Imaging: Imaging results have been reviewed  Assessment/Plan:   1. Active Problems: 2.   Atrial fibrillation with rapid ventricular response 3.   Time Spent Directly with Patient:  15 minutes  Length of Stay:  LOS: 3 days  Remains in AFIB with CVR on low dose BB. 2D not yet performed but can be done as OP. Agree that ASA will be sufficient for cardioembolic protection. OK for DC from our point of view. Will follow up as OP.   Runell Gess 12/18/2012, 9:01 AM

## 2012-12-18 NOTE — Progress Notes (Signed)
Pt discharge instructions and education completed with family and patient. IV site d/c. Site WNL. Echo performed. Follow up appointments arranged. No further questions. D/C home with family. Dion Saucier

## 2012-12-18 NOTE — Progress Notes (Signed)
  Echocardiogram 2D Echocardiogram has been performed.  Jorje Guild 12/18/2012, 10:46 AM

## 2012-12-27 ENCOUNTER — Encounter: Payer: Self-pay | Admitting: Physician Assistant

## 2012-12-27 ENCOUNTER — Ambulatory Visit (INDEPENDENT_AMBULATORY_CARE_PROVIDER_SITE_OTHER): Payer: No Typology Code available for payment source | Admitting: Physician Assistant

## 2012-12-27 VITALS — BP 120/88 | HR 87 | Ht 67.0 in | Wt 185.2 lb

## 2012-12-27 DIAGNOSIS — E66811 Obesity, class 1: Secondary | ICD-10-CM | POA: Insufficient documentation

## 2012-12-27 DIAGNOSIS — E119 Type 2 diabetes mellitus without complications: Secondary | ICD-10-CM

## 2012-12-27 DIAGNOSIS — I4891 Unspecified atrial fibrillation: Secondary | ICD-10-CM

## 2012-12-27 DIAGNOSIS — E669 Obesity, unspecified: Secondary | ICD-10-CM | POA: Insufficient documentation

## 2012-12-27 DIAGNOSIS — E1169 Type 2 diabetes mellitus with other specified complication: Secondary | ICD-10-CM | POA: Insufficient documentation

## 2012-12-27 NOTE — Assessment & Plan Note (Signed)
Increase aspirin 325 mg daily. Patient is a very well compensated in atrial fibrillation with controlled ventricular rate. He is on no rate control medication other than small dose of Lopressor. He'll follow up in 3 months with Dr. see it at that time continues in atrial fibrillation considered TEE/DCC V.

## 2012-12-27 NOTE — Assessment & Plan Note (Signed)
I will try and arrange a nutrition and diabetes consult

## 2012-12-27 NOTE — Patient Instructions (Signed)
Start taking aspirin 325 mg daily. He noticed he started feeling dizzy or lightheaded and her heart rate is elevated higher than usual, call office for appointment. Otherwise he will followup with Dr. Royann Shivers in 3 months

## 2012-12-27 NOTE — Progress Notes (Signed)
Patient ID: Donald Chang, male   DOB: June 09, 1956, 57 y.o.   MRN: [id number]    Date:  12/27/2012   ID:  [facility], DOB 12-21-1955, MRN [id number]  PCP:  No PCP Per Patient  Primary Cardiologist:       History of Present Illness: Donald Chang is a 57 y.o. male  Patient is a 57 year old male with past medical history of a possible cardiac arrhythmia approximately 7 years ago. He says he had seen his Dr. for cholesterol at that time and had put him on some sort of medicine. Patient presented yesterday after he slipped going down some stairs and fell forward landing on his chest and hitting a brick that was protruding from a walkway. He was noted to be in atrial fibrillation in the emergency room. His daughter states that his heart rate was elevated approximately 2 weeks ago when she checked his pulse. She did not notice that was irregular.  He was previously undiagnosed for diabetes. His A1c is 8.2. He was started on metformin prior to discharge.  He does continue to have very mild amount of chest pain. Norco helps with the pain.  The patient currently denies nausea, vomiting, fever, chest pain, shortness of breath, orthopnea, dizziness, PND, cough, congestion, abdominal pain, hematochezia, melena, lower extremity edema, claudication.  Wt Readings from Last 3 Encounters:  12/27/12 185 lb 3.2 oz (84.006 kg)  12/16/12 178 lb 12.7 oz (81.1 kg)     No past medical history on file.  Current Outpatient Prescriptions  Medication Sig Dispense Refill  . aspirin 81 MG tablet Take 81 mg by mouth daily.      Marland Kitchen HYDROcodone-acetaminophen (NORCO) 10-325 MG per tablet Take 1 tablet by mouth every 4 (four) hours as needed (1/2 tablet for mild pain, 1 tablet for moderate pain, 2 tablets for severe pain).  60 tablet  0  . metFORMIN (GLUCOPHAGE) 500 MG tablet Take 1 tablet (500 mg total) by mouth 2 (two) times daily with a meal.  60 tablet  1  . metoprolol tartrate (LOPRESSOR) 12.5 mg TABS Take 0.5  tablets (12.5 mg total) by mouth 2 (two) times daily.  60 tablet  1  . naproxen (NAPROSYN) 500 MG tablet Take 1 tablet (500 mg total) by mouth 2 (two) times daily with a meal.  60 tablet  1  . polyethylene glycol (MIRALAX / GLYCOLAX) packet Take 17 g by mouth daily.  1 each  0   No current facility-administered medications for this visit.    Allergies:   No Known Allergies  Social History:  The patient  reports that he has never smoked. He does not have any smokeless tobacco history on file. He reports that  drinks alcohol. He reports that he does not use illicit drugs.   Family history:  No family history on file.  ROS:  Please see the history of present illness.  All other systems reviewed and negative.   PHYSICAL EXAM: VS:  BP 120/88  Pulse 87  Ht 5\' 7"  (1.702 m)  Wt 185 lb 3.2 oz (84.006 kg)  BMI 29 kg/m2 Well nourished, well developed, in no acute distress HEENT: Pupils are equal round react to light accommodation extraocular movements are intact.  Neck: no JVDNo cervical lymphadenopathy. Cardiac: Regular rate and rhythm without murmurs rubs or gallops. Lungs:  clear to auscultation bilaterally, no wheezing, rhonchi or rales Abd: soft, nontender, positive bowel sounds all quadrants, no hepatosplenomegaly Ext: no lower extremity edema.  2+ radial and dorsalis  pedis pulses. Skin: warm and dry Neuro:  Grossly normal  EKG:  Atrial fibrillation rate of 87 beats per minute.  ASSESSMENT AND PLAN:  Problem List Items Addressed This Visit   Obesity     I will try and arrange a nutrition and diabetes consult    Diabetes mellitus type 2 in obese   Relevant Medications      aspirin 81 MG tablet   Atrial fibrillation with rapid ventricular response     Increase aspirin 325 mg daily. Patient is a very well compensated in atrial fibrillation with controlled ventricular rate. He is on no rate control medication other than small dose of Lopressor. He'll follow up in 3 months with Dr.  see it at that time continues in atrial fibrillation considered TEE/DCC V.    Relevant Medications      aspirin 81 MG tablet    Other Visit Diagnoses   Atrial fibrillation    -  Primary    Relevant Medications       aspirin 81 MG tablet    Other Relevant Orders       EKG 12-Lead

## 2013-03-28 ENCOUNTER — Encounter: Payer: Self-pay | Admitting: Cardiovascular Disease

## 2013-03-28 ENCOUNTER — Ambulatory Visit (INDEPENDENT_AMBULATORY_CARE_PROVIDER_SITE_OTHER): Payer: No Typology Code available for payment source | Admitting: Cardiovascular Disease

## 2013-03-28 VITALS — BP 110/60 | HR 76 | Resp 16 | Ht 65.5 in | Wt 176.2 lb

## 2013-03-28 DIAGNOSIS — I4891 Unspecified atrial fibrillation: Secondary | ICD-10-CM

## 2013-03-28 NOTE — Progress Notes (Signed)
Patient ID: Donald Chang, male   DOB: 1955-07-03, 57 y.o.   MRN: [id number]     Reason for office visit Atrial fibrillation   He was initially seen for a sternal fracture and AF with RVR, thought to be possibly related to myocardial contusion. His left atrium was mildly dilated and there was an extrapericardial mediastinal hematoma, but no other structural heart disease. Newly diagnosed with DM, but he does not have a history of CVA/TIA, HTN, CHF. He works as a Designer, fashion/clothing.  If he turn s sideways while carrying weight he has tightness in the costochondral joint area, but is otherwise asymptomatic, despite a physically demanding job.  It later became apparent that he had previously had AF and that the arrhythmia was not related to the traumatism.   No Known Allergies  Current Outpatient Prescriptions  Medication Sig Dispense Refill  . amoxicillin (AMOXIL) 250 MG capsule Take 250 mg by mouth 3 (three) times daily.      Marland Kitchen aspirin 81 MG tablet Take 81 mg by mouth daily.      . metFORMIN (GLUCOPHAGE) 500 MG tablet Take 1 tablet (500 mg total) by mouth 2 (two) times daily with a meal.  60 tablet  1  . metoprolol tartrate (LOPRESSOR) 12.5 mg TABS Take 0.5 tablets (12.5 mg total) by mouth 2 (two) times daily.  60 tablet  1   No current facility-administered medications for this visit.    No past medical history on file.  No past surgical history on file.  No family history on file.  History   Social History  . Marital Status: Married    Spouse Name: N/A    Number of Children: N/A  . Years of Education: N/A   Occupational History  . Not on file.   Social History Main Topics  . Smoking status: Never Smoker   . Smokeless tobacco: Not on file  . Alcohol Use: Yes     Comment: occas.  . Drug Use: No  . Sexual Activity: Not on file   Other Topics Concern  . Not on file   Social History Narrative  . No narrative on file    Review of systems: The patient specifically denies  any chest pain at rest or with exertion, dyspnea at rest or with exertion, orthopnea, paroxysmal nocturnal dyspnea, syncope, palpitations, focal neurological deficits, intermittent claudication, lower extremity edema, unexplained weight gain, cough, hemoptysis or wheezing.  The patient also denies abdominal pain, nausea, vomiting, dysphagia, diarrhea, constipation, polyuria, polydipsia, dysuria, hematuria, frequency, urgency, abnormal bleeding or bruising, fever, chills, unexpected weight changes, mood swings, change in skin or hair texture, change in voice quality, auditory or visual problems, allergic reactions or rashes, new musculoskeletal complaints other than usual "aches and pains".   PHYSICAL EXAM BP 110/60  Pulse 76  Resp 16  Ht 5' 5.5" (1.664 m)  Wt 176 lb 3.2 oz (79.924 kg)  BMI 28.86 kg/m2  General: Alert, oriented x3, no distress Head: no evidence of trauma, PERRL, EOMI, no exophtalmos or lid lag, no myxedema, no xanthelasma; normal ears, nose and oropharynx Neck: normal jugular venous pulsations and no hepatojugular reflux; brisk carotid pulses without delay and no carotid bruits Chest: clear to auscultation, no signs of consolidation by percussion or palpation, normal fremitus, symmetrical and full respiratory excursions Cardiovascular: normal position and quality of the apical impulse, irregular rhythm, normal first and second heart sounds, no murmurs, rubs or gallops Abdomen: no tenderness or distention, no masses by palpation, no abnormal pulsatility  or arterial bruits, normal bowel sounds, no hepatosplenomegaly Extremities: no clubbing, cyanosis or edema; 2+ radial, ulnar and brachial pulses bilaterally; 2+ right femoral, posterior tibial and dorsalis pedis pulses; 2+ left femoral, posterior tibial and dorsalis pedis pulses; no subclavian or femoral bruits Neurological: grossly nonfocal   EKG: atrial fibrillation, otw normal  Lipid Panel  No results found for this  basename: chol, trig, hdl, cholhdl, vldl, ldlcalc    BMET    Component Value Date/Time   NA 138 12/15/2012 2029   K 4.8 12/15/2012 2029   CL 103 12/15/2012 2029   CO2 25 12/15/2012 2029   GLUCOSE 267* 12/15/2012 2029   BUN 21 12/15/2012 2029   CREATININE 0.79 12/15/2012 2029   CALCIUM 9.3 12/15/2012 2029   GFRNONAA >90 12/15/2012 2029   GFRAA >90 12/15/2012 2029     ASSESSMENT AND PLAN Atrial fibrillation with rapid ventricular response Rate is well controlled on minimum dose of beta blocker. CHADS2 score is only 1. ASA is appropriate for CVA prevention. 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  Orders Placed This Encounter  Procedures  . EKG 12-Lead   Meds ordered this encounter  Medications  . amoxicillin (AMOXIL) 250 MG capsule    Sig: Take 250 mg by mouth 3 (three) times daily.    Junious Silk, MD, [facility] [phone] office [phone] pager

## 2013-03-28 NOTE — Assessment & Plan Note (Addendum)
Rate is well controlled on minimum dose of beta blocker. CHADS2 score is only 1. ASA is appropriate for CVA prevention. 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  It appears likely that he has permanent AF. Since he is completely asymptomatic, I doubt he would benefit from cardioversion/antiarrhythmics/ablation.

## 2013-03-28 NOTE — Patient Instructions (Addendum)
Your physician recommends that you schedule a follow-up appointment in: One year.  

## 2013-12-08 IMAGING — CT CT CHEST W/ CM
2 of 3 series · 15 of 36 positions shown, 18 images · IV contrast (100ml omni 300)
Comparison: Chest x-ray 12/15/2012.

CLINICAL DATA: Chest pain.  Possible sternal fracture.

CT CHEST WITH CONTRAST
TECHNIQUE: Multidetector CT imaging of the chest was performed
following the standard protocol during bolus administration of
intravenous contrast.
Contrast: 100mL OMNIPAQUE IOHEXOL 300 MG/ML  SOLN

[Series 2: routine chest · axial · 0.76mm/px · z∈[-253,-3]mm · 12 of 60 slices shown, 15 images]
[im 5/60  mediastinal]
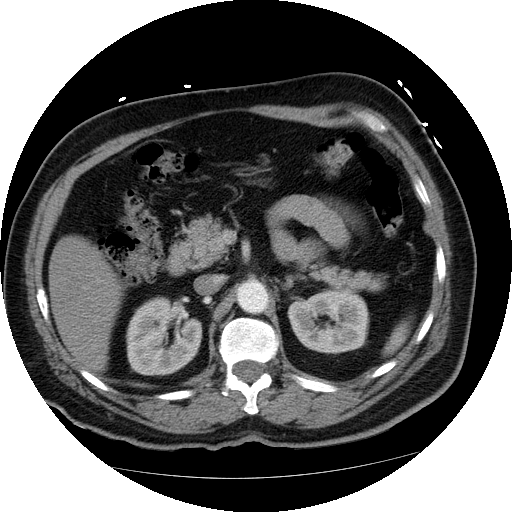
[im 5/60  lung]
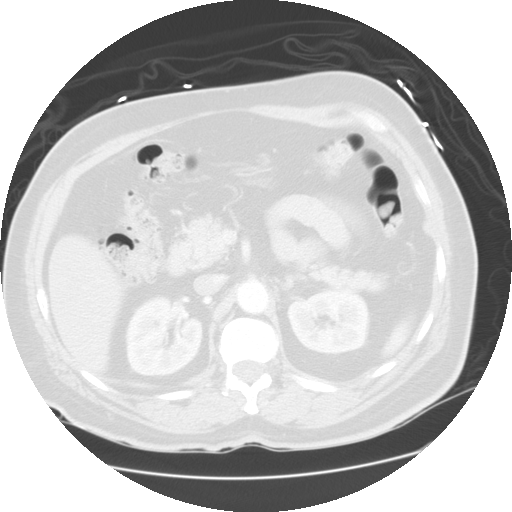
[im 9/60  lung]
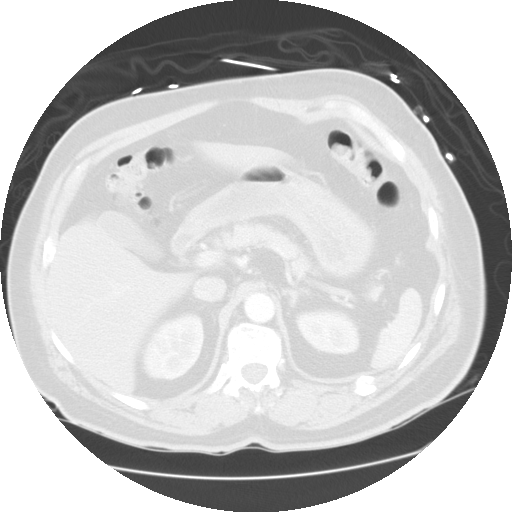
[im 14/60  lung]
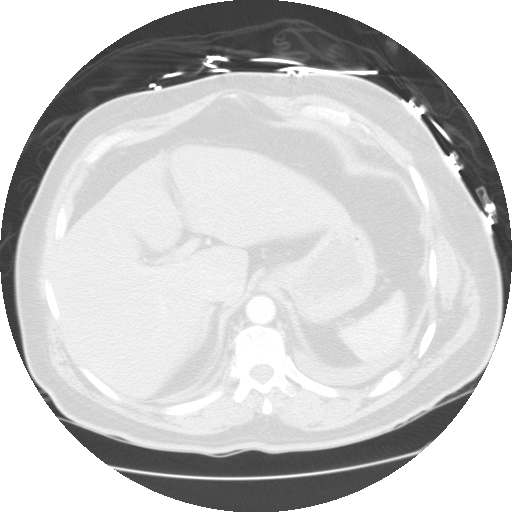
[im 18/60  lung]
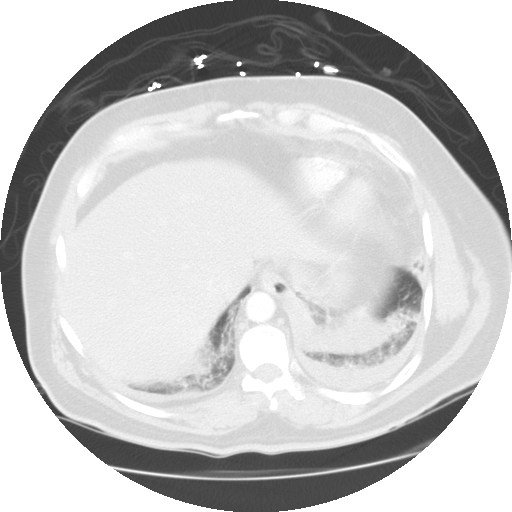
[im 22/60  mediastinal]
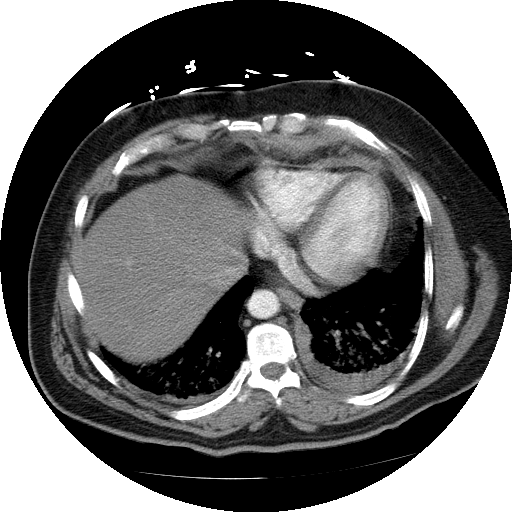
[im 22/60  lung]
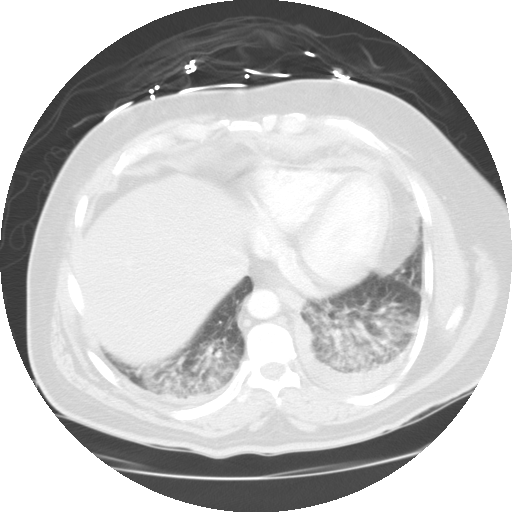
[im 27/60  lung]
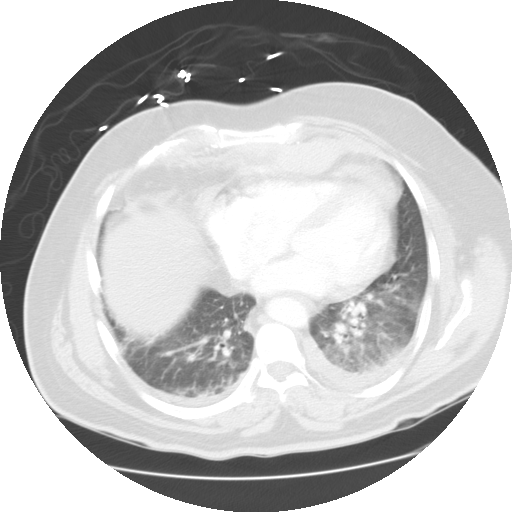
[im 33/60  lung]
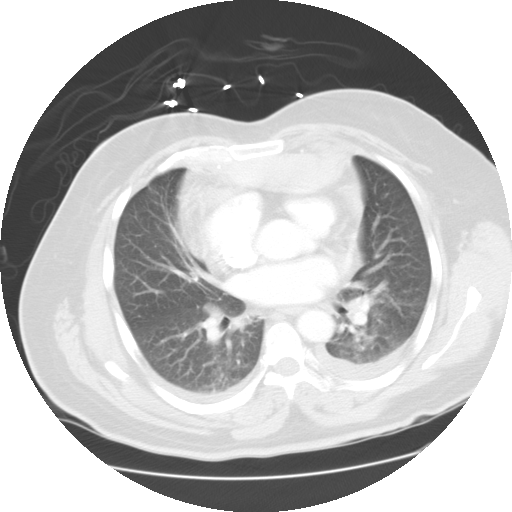
[im 38/60  lung]
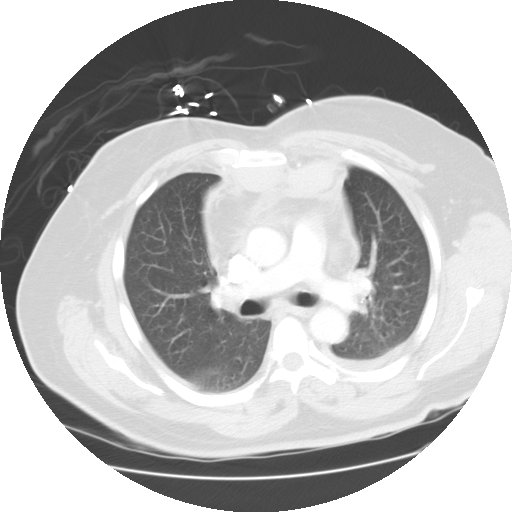
[im 42/60  mediastinal]
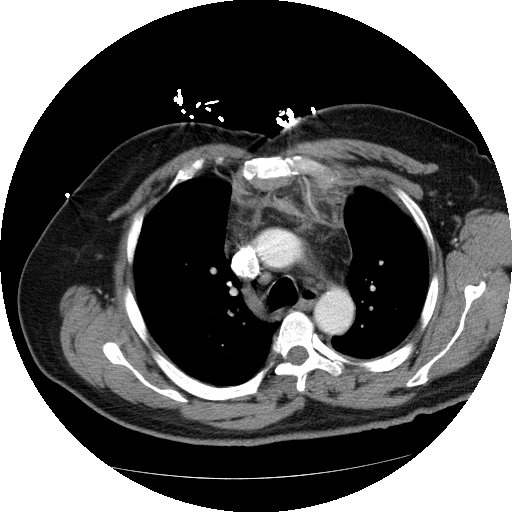
[im 42/60  lung]
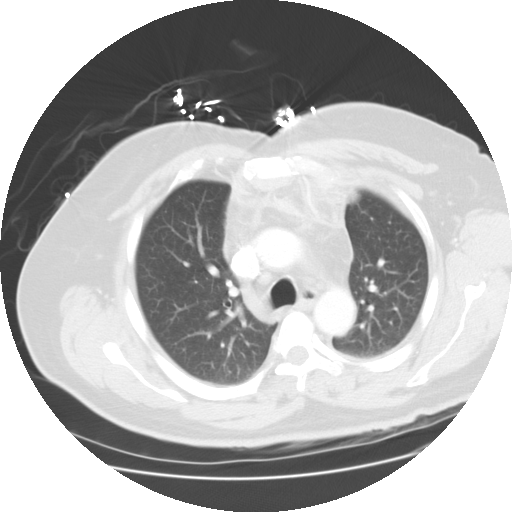
[im 46/60  lung]
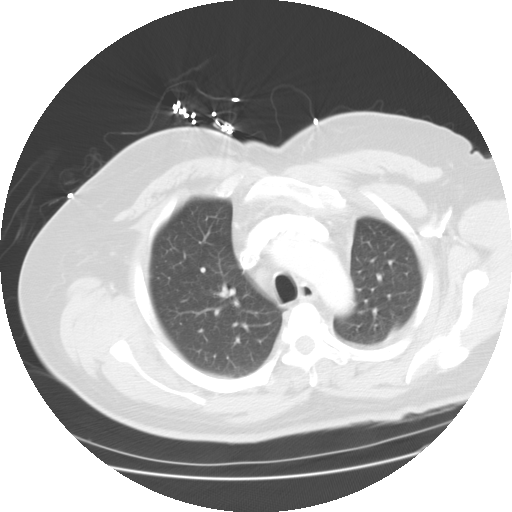
[im 51/60  lung]
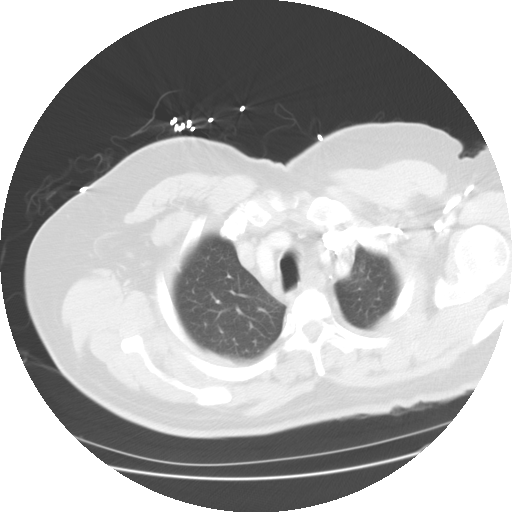
[im 55/60  lung]
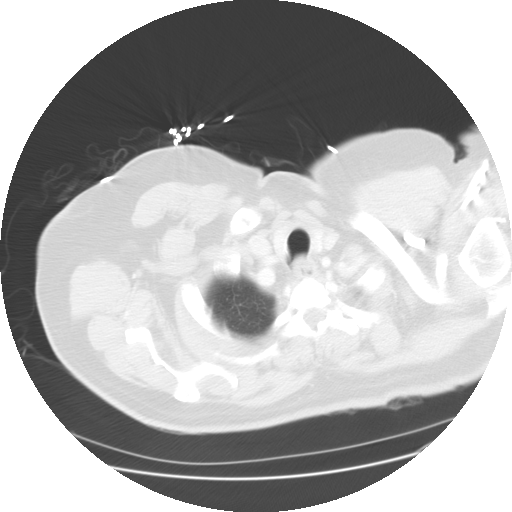

[Series 401: cor · coronal · 0.76mm/px · 3 of 95 slices shown]
[im 19/95  lung]
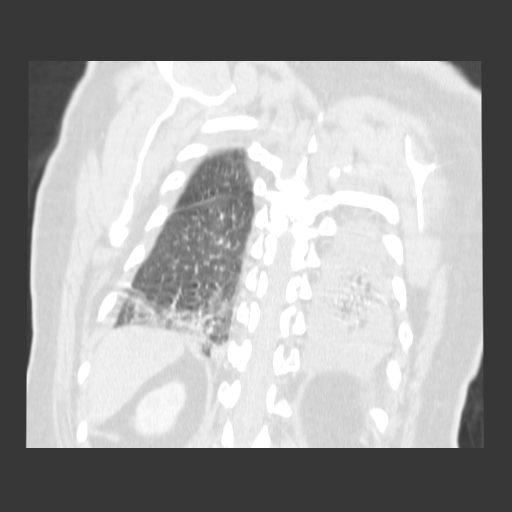
[im 38/95  lung]
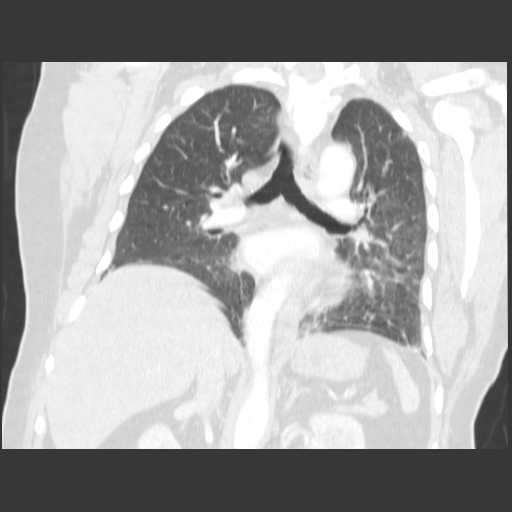
[im 57/95  lung]
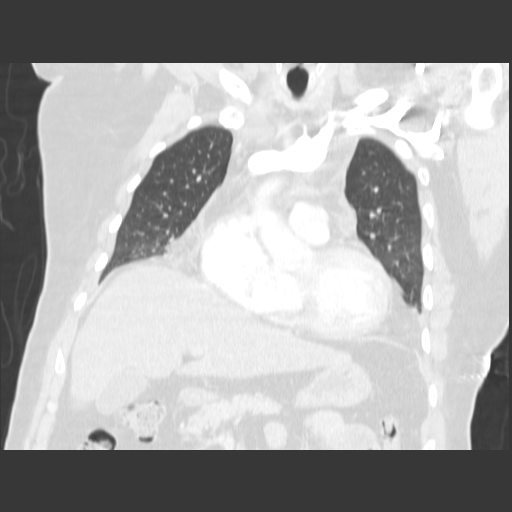

[15 of 36 positions shown; findings below may reference images not displayed]

FINDINGS: Mediastinum: There is a large amount of high attenuation fluid in
the anterior mediastinum and deep to the known sternal fracture,
compatible with hematoma.  This exerts mass effect upon the
underlying heart and other adjacent structures.  This hematoma
measures approximately 10.6 x 3.1 by 9.8 cm in greatest dimensions.
No acute abnormality of the thoracic aorta; specifically, no
aneurysm or dissection. Heart size is normal. No significant
pericardial fluid, thickening or pericardial calcification.
Borderline enlarged right paratracheal lymph nodes measuring 1 cm
in short axis are nonspecific and may be reactive.  Esophagus is
unremarkable in appearance.

Lungs/Pleura: Small bilateral pleural effusions (left greater than
right).  Thickening of the peribronchovascular interstitium
throughout the lower lobes of the lungs bilaterally (left greater
than right) is nonspecific, but favored to represent sequelae of
recent aspiration.  No pneumothorax.

Upper Abdomen: Unremarkable.

Musculoskeletal: Minimally displaced fracture of the sternum
approximately 2 cm beneath the sternomanubrial joint is noted.  Old
healed fractures of the left ninth, tenth and eleventh ribs
posteriorly are noted. There are no aggressive appearing lytic or
blastic lesions noted in the visualized portions of the skeleton.
IMPRESSION: 1.  Minimally displaced sternal fracture with large anterior
mediastinal hematoma, as above.
2.  No definite vascular injury in the mediastinum (i.e., the blood
does not appear to be related to an acute aortic or major venous
injury).
3.  Findings in the lower lobes of the lungs suspicious for
aspiration pneumonitis with small bilateral pleural effusions (left
greater than right).

## 2014-05-30 ENCOUNTER — Emergency Department (HOSPITAL_COMMUNITY)
Admission: EM | Admit: 2014-05-30 | Discharge: 2014-05-30 | Disposition: A | Discharge status: Home / Self Care | Payer: BC Managed Care – PPO | Attending: Emergency Medicine | Admitting: Emergency Medicine

## 2014-05-30 ENCOUNTER — Encounter (HOSPITAL_COMMUNITY): Payer: Self-pay

## 2014-05-30 DIAGNOSIS — Z7982 Long term (current) use of aspirin: Secondary | ICD-10-CM | POA: Diagnosis not present

## 2014-05-30 DIAGNOSIS — Y9389 Activity, other specified: Secondary | ICD-10-CM | POA: Insufficient documentation

## 2014-05-30 DIAGNOSIS — Y998 Other external cause status: Secondary | ICD-10-CM | POA: Insufficient documentation

## 2014-05-30 DIAGNOSIS — Z79899 Other long term (current) drug therapy: Secondary | ICD-10-CM | POA: Diagnosis not present

## 2014-05-30 DIAGNOSIS — E119 Type 2 diabetes mellitus without complications: Secondary | ICD-10-CM | POA: Insufficient documentation

## 2014-05-30 DIAGNOSIS — S4990XA Unspecified injury of shoulder and upper arm, unspecified arm, initial encounter: Secondary | ICD-10-CM | POA: Diagnosis present

## 2014-05-30 DIAGNOSIS — Z792 Long term (current) use of antibiotics: Secondary | ICD-10-CM | POA: Diagnosis not present

## 2014-05-30 DIAGNOSIS — I10 Essential (primary) hypertension: Secondary | ICD-10-CM | POA: Diagnosis not present

## 2014-05-30 DIAGNOSIS — Y92410 Unspecified street and highway as the place of occurrence of the external cause: Secondary | ICD-10-CM | POA: Diagnosis not present

## 2014-05-30 DIAGNOSIS — M791 Myalgia, unspecified site: Secondary | ICD-10-CM

## 2014-05-30 HISTORY — DX: Hyperlipidemia, unspecified: E78.5

## 2014-05-30 HISTORY — DX: Essential (primary) hypertension: I10

## 2014-05-30 HISTORY — DX: Type 2 diabetes mellitus without complications: E11.9

## 2014-05-30 MED ORDER — METHOCARBAMOL 500 MG PO TABS: 1000.0000 mg | ORAL_TABLET | Freq: Four times a day (QID) | ORAL | Status: DC
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
## 2014-06-06 ENCOUNTER — Ambulatory Visit (INDEPENDENT_AMBULATORY_CARE_PROVIDER_SITE_OTHER): Payer: BC Managed Care – PPO | Admitting: Cardiovascular Disease

## 2014-06-06 ENCOUNTER — Encounter: Payer: Self-pay | Admitting: Cardiovascular Disease

## 2014-06-06 VITALS — BP 120/76 | HR 86 | Resp 14 | Ht 65.0 in | Wt 173.1 lb

## 2014-06-06 DIAGNOSIS — I4811 Longstanding persistent atrial fibrillation: Secondary | ICD-10-CM

## 2014-06-06 DIAGNOSIS — I481 Persistent atrial fibrillation: Secondary | ICD-10-CM

## 2014-06-06 DIAGNOSIS — I4819 Other persistent atrial fibrillation: Secondary | ICD-10-CM

## 2014-06-06 NOTE — Patient Instructions (Signed)
Dr Croitoru recommends that you schedule a follow-up appointment in 1 year. You will receive a reminder letter in the mail two months in advance. If you don't receive a letter, please call our office to schedule the follow-up appointment. 

## 2014-06-07 ENCOUNTER — Encounter: Payer: Self-pay | Admitting: Cardiovascular Disease

## 2014-06-07 NOTE — Assessment & Plan Note (Signed)
Well rate controlled on a very low dose of beta blocker (suggests AV node disease), no major structura carvdiovascular problems, appropriately and therapeutically anticoagulated. No cahnges recommended today.
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
## 2015-05-07 ENCOUNTER — Ambulatory Visit (INDEPENDENT_AMBULATORY_CARE_PROVIDER_SITE_OTHER): Payer: 59 | Admitting: Cardiovascular Disease

## 2015-05-07 ENCOUNTER — Encounter: Payer: Self-pay | Admitting: Cardiovascular Disease

## 2015-05-07 VITALS — BP 118/84 | HR 93 | Ht 65.0 in | Wt 169.0 lb

## 2015-05-07 DIAGNOSIS — I481 Persistent atrial fibrillation: Secondary | ICD-10-CM

## 2015-05-07 DIAGNOSIS — I4811 Longstanding persistent atrial fibrillation: Secondary | ICD-10-CM

## 2015-05-07 NOTE — Progress Notes (Signed)
Patient ID: Donald Chang, male   DOB: 01/29/1956, 59 y.o.   MRN: [id number]      Cardiology Office Note   Date:  05/07/2015   [name], DOB 01/29/1956, MRN [id number]  PCP:  Jonny RuizLlibre, Giovanni, MD  Cardiologist:   Thurmon FairROITORU,Mikko Lewellen, MD   Chief Complaint  Patient presents with  . Follow-up  . Dizziness      History of Present Illness: Donald PoliceVeronico Chang is a 59 y.o. male who presents for  Follow-up for atrial fibrillation (long-term persistent, probably permanent) in the setting of well-controlled hypertension, hyperlipidemia and type 2 diabetes mellitus.   his arrhythmia was initially detected when he had a sternal fracture after a fall, but retrospectively, it was likely already present. He has a severely dilated left atrium on echocardiography but has normal left ventricular systolic function. He has never had congestive heart failure. He is tolerating anticoagulation with Xarelto without complications and is on a low dose of beta blocker for ventricular rate control.  He continues to work in Holiday representativeconstruction and does not feel limited by any cardiovascular symptoms. He denies syncope, bleeding, focal neurological episodes, intermittent claudication, lower extremity edema, palpitations, angina or dyspnea with activity.    Past Medical History  Diagnosis Date  . Hypertension   . Hyperlipidemia   . Diabetes mellitus without complication (HCC)     History reviewed. No pertinent past surgical history.   Current Outpatient Prescriptions  Medication Sig Dispense Refill  . GINSENG PO Take by mouth. "ShotB" Ginseng 40.0 - Takes 1 capsule by mouth daily    . metFORMIN (GLUCOPHAGE) 1000 MG tablet Take 1,000 mg by mouth 2 (two) times daily.  1  . metoprolol succinate (TOPROL-XL) 25 MG 24 hr tablet Take 25 mg by mouth daily.  0  . XARELTO 20 MG TABS tablet Take 1 tablet by mouth daily.  4   No current facility-administered medications for this visit.    Allergies:   Review of  patient's allergies indicates no known allergies.    Social History:  The patient  reports that he has never smoked. He does not have any smokeless tobacco history on file. He reports that he drinks alcohol. He reports that he does not use illicit drugs.   Family History:  The patient's  Family history is not well known  ROS:  Please see the history of present illness.    Otherwise, review of systems positive for none.   All other systems are reviewed and negative.    PHYSICAL EXAM: VS:  BP 118/84 mmHg  Pulse 93  Ht 5\' 5"  (1.651 m)  Wt 169 lb (76.658 kg)  BMI 28.12 kg/m2 , BMI Body mass index is 28.12 kg/(m^2).  General: Alert, oriented x3, no distress Head: no evidence of trauma, PERRL, EOMI, no exophtalmos or lid lag, no myxedema, no xanthelasma family history is not well-known; normal ears, nose and oropharynx Neck: normal jugular venous pulsations and no hepatojugular reflux; brisk carotid pulses without delay and no carotid bruits Chest: clear to auscultation, no signs of consolidation by percussion or palpation, normal fremitus, symmetrical and full respiratory excursions Cardiovascular: normal position and quality of the apical impulse, irregular rhythm, normal first and second heart sounds, no murmurs, rubs or gallops Abdomen: no tenderness or distention, no masses by palpation, no abnormal pulsatility or arterial bruits, normal bowel sounds, no hepatosplenomegaly Extremities: no clubbing, cyanosis or edema; 2+ radial, ulnar and brachial pulses bilaterally; 2+ right femoral, posterior tibial and dorsalis pedis pulses; 2+ left  femoral, posterior tibial and dorsalis pedis pulses; no subclavian or femoral bruits Neurological: grossly nonfocal Psych: euthymic mood, full affect   EKG:  EKG is ordered today. The ekg ordered today demonstrates  Coarse atrial fibrillation, otherwise normal   Recent Labs: No results found for requested labs within last 365 days.    Lipid  Panel No results found for: CHOL, TRIG, HDL, CHOLHDL, VLDL, LDLCALC, LDLDIRECT    Wt Readings from Last 3 Encounters:  05/07/15 169 lb (76.658 kg)  06/06/14 173 lb 1.6 oz (78.518 kg)  03/28/13 176 lb 3.2 oz (79.924 kg)      ASSESSMENT AND PLAN:   long-term persistent atrial fibrillation, likely permanent atrial fibrillation with well controlled cardiac risk factors , well controlled ventricular rate and appropriate anticoagulation. Needs renal function evaluation at least once yearly due to his anticoagulant.    Current medicines are reviewed at length with the patient today.  The patient does not have concerns regarding medicines.  The following changes have been made:  no change  Labs/ tests ordered today include:  No orders of the defined types were placed in this encounter.     Patient Instructions  Dr. Royann Shivers recommends that you schedule a follow-up appointment in: ONE YEAR        SignedThurmon Fair, MD  05/07/2015 9:22 AM    Thurmon Fair, MD, [facility] [phone] office [phone] pager

## 2015-05-07 NOTE — Patient Instructions (Signed)
Dr. Croitoru recommends that you schedule a follow-up appointment in: ONE YEAR   

## 2016-01-14 DIAGNOSIS — I480 Paroxysmal atrial fibrillation: Secondary | ICD-10-CM | POA: Insufficient documentation

## 2016-01-14 DIAGNOSIS — E785 Hyperlipidemia, unspecified: Secondary | ICD-10-CM | POA: Insufficient documentation

## 2018-07-29 ENCOUNTER — Ambulatory Visit: Payer: No Typology Code available for payment source | Admitting: Cardiovascular Disease

## 2019-10-25 ENCOUNTER — Other Ambulatory Visit: Payer: Self-pay

## 2019-10-25 ENCOUNTER — Encounter: Payer: Self-pay | Admitting: Emergency Medicine

## 2019-10-25 ENCOUNTER — Ambulatory Visit (INDEPENDENT_AMBULATORY_CARE_PROVIDER_SITE_OTHER): Payer: 59 | Admitting: Emergency Medicine

## 2019-10-25 VITALS — BP 140/93 | HR 80 | Temp 97.9°F | Ht 60.5 in | Wt 174.8 lb

## 2019-10-25 DIAGNOSIS — Z7901 Long term (current) use of anticoagulants: Secondary | ICD-10-CM

## 2019-10-25 DIAGNOSIS — I152 Hypertension secondary to endocrine disorders: Secondary | ICD-10-CM

## 2019-10-25 DIAGNOSIS — E1159 Type 2 diabetes mellitus with other circulatory complications: Secondary | ICD-10-CM | POA: Diagnosis not present

## 2019-10-25 DIAGNOSIS — I4811 Longstanding persistent atrial fibrillation: Secondary | ICD-10-CM

## 2019-10-25 DIAGNOSIS — Z7689 Persons encountering health services in other specified circumstances: Secondary | ICD-10-CM

## 2019-10-25 DIAGNOSIS — E1169 Type 2 diabetes mellitus with other specified complication: Secondary | ICD-10-CM

## 2019-10-25 DIAGNOSIS — E669 Obesity, unspecified: Secondary | ICD-10-CM

## 2019-10-25 DIAGNOSIS — I10 Essential (primary) hypertension: Secondary | ICD-10-CM

## 2019-10-25 DIAGNOSIS — E119 Type 2 diabetes mellitus without complications: Secondary | ICD-10-CM

## 2019-10-25 DIAGNOSIS — D6869 Other thrombophilia: Secondary | ICD-10-CM | POA: Insufficient documentation

## 2019-10-25 MED ORDER — ROSUVASTATIN CALCIUM 10 MG PO TABS: 10.0000 mg | ORAL_TABLET | Freq: Every day | ORAL | 3 refills | Status: DC

## 2019-10-25 NOTE — Progress Notes (Signed)
Donald Chang 64 y.o.   Chief Complaint  Patient presents with  . Establish Care  . Diabetes    HISTORY OF PRESENT ILLNESS: This is a 64 y.o. male first visit to this office here to establish care with me. 1.  Diabetes: On Metformin 1000 mg twice a day. 2.  Chronic persistent atrial fibrillation: On Xarelto 20 mg daily and metoprolol succinate 25 mg daily 3.  Hypertension Non-smoker.  From [place]. No complaints or medical concerns today.  HPI   Prior to Admission medications   Medication Sig Start Date End Date Taking? Authorizing Provider  metFORMIN (GLUCOPHAGE) 1000 MG tablet Take 1,000 mg by mouth 2 (two) times daily. 04/06/14  Yes [provider]  metoprolol succinate (TOPROL-XL) 25 MG 24 hr tablet Take 25 mg by mouth daily. 04/06/14  Yes [provider]  XARELTO 20 MG TABS tablet Take 1 tablet by mouth daily. 05/28/14  Yes [provider]  GINSENG PO Take by mouth. "ShotB" Ginseng 40.0 - Takes 1 capsule by mouth daily    [provider]    No Known Allergies  Patient Active Problem List   Diagnosis Date Noted  . Diabetes mellitus type 2 in obese (HCC) 12/27/2012  . Obesity 12/27/2012  . Longstanding persistent atrial fibrillation (HCC) 12/16/2012    Past Medical History:  Diagnosis Date  . Diabetes mellitus without complication (HCC)   . Hyperlipidemia   . Hypertension     History reviewed. No pertinent surgical history.  Social History   Socioeconomic History  . Marital status: Married    Spouse name: Not on file  . Number of children: Not on file  . Years of education: Not on file  . Highest education level: Not on file  Occupational History  . Not on file  Tobacco Use  . Smoking status: Never Smoker  Substance and Sexual Activity  . Alcohol use: Yes    Comment: occas.  . Drug use: No  . Sexual activity: Not on file  Other Topics Concern  . Not on file  Social History Narrative  . Not on file   Social  Determinants of Health   Financial Resource Strain:   . Difficulty of Paying Living Expenses:   Food Insecurity:   . Worried About Programme researcher, broadcasting/film/video in the Last Year:   . Barista in the Last Year:   Transportation Needs:   . Freight forwarder (Medical):   Marland Kitchen Lack of Transportation (Non-Medical):   Physical Activity:   . Days of Exercise per Week:   . Minutes of Exercise per Session:   Stress:   . Feeling of Stress :   Social Connections:   . Frequency of Communication with Friends and Family:   . Frequency of Social Gatherings with Friends and Family:   . Attends Religious Services:   . Active Member of Clubs or Organizations:   . Attends Banker Meetings:   Marland Kitchen Marital Status:   Intimate Partner Violence:   . Fear of Current or Ex-Partner:   . Emotionally Abused:   Marland Kitchen Physically Abused:   . Sexually Abused:     History reviewed. No pertinent family history.   Review of Systems  Constitutional: Negative.  Negative for chills and fever.  HENT: Negative.  Negative for congestion and sore throat.   Respiratory: Negative.  Negative for cough and shortness of breath.   Cardiovascular: Negative.  Negative for chest pain and palpitations.  Gastrointestinal: Negative.  Negative for abdominal pain, diarrhea, nausea and vomiting.  Genitourinary: Negative.  Negative for dysuria and hematuria.  Musculoskeletal: Negative.  Negative for back pain, myalgias and neck pain.  Skin: Negative.  Negative for rash.  Neurological: Negative for dizziness and headaches.  All other systems reviewed and are negative.    Physical Exam Vitals reviewed.  Constitutional:      Appearance: Normal appearance.  HENT:     Head: Normocephalic.  Eyes:     Extraocular Movements: Extraocular movements intact.     Conjunctiva/sclera: Conjunctivae normal.     Pupils: Pupils are equal, round, and reactive to light.  Cardiovascular:     Rate and Rhythm: Normal rate. Rhythm  irregular.     Pulses: Normal pulses.     Heart sounds: Normal heart sounds.  Pulmonary:     Effort: Pulmonary effort is normal.     Breath sounds: Normal breath sounds.  Musculoskeletal:        General: Normal range of motion.     Cervical back: Normal range of motion and neck supple.  Skin:    General: Skin is warm and dry.     Capillary Refill: Capillary refill takes less than 2 seconds.  Neurological:     General: No focal deficit present.     Mental Status: He is alert and oriented to person, place, and time.  Psychiatric:        Mood and Affect: Mood normal.        Behavior: Behavior normal.     A total of 50 minutes was spent with the patient, greater than 50% of which was in counseling/coordination of care regarding chronic medical problems, review of past medical history, cardiovascular risks associated with these conditions, review of all medications, review of most recent blood work results.  Review of most recent office visit notes with other providers, diet and nutrition, establishing care, prognosis and need for follow-up in 3 months.  ASSESSMENT & PLAN: Naim was seen today for establish care and diabetes.  Diagnoses and all orders for this visit:  Diabetes mellitus type 2 in obese ([place]) -     CBC with Differential/Platelet -     Comprehensive metabolic panel -     Hemoglobin A1c -     Lipid panel -     rosuvastatin (CRESTOR) 10 MG tablet; Take 1 tablet (10 mg total) by mouth daily.  Longstanding persistent atrial fibrillation (HCC)  Essential hypertension -     CBC with Differential/Platelet -     Comprehensive metabolic panel  Hypertension associated with diabetes ([place])  Encounter to establish care  Current use of long term anticoagulation    Patient Instructions       If you have lab work done today you will be contacted with your lab results within the next 2 weeks.  If you have not heard from [place] then please contact us. The fastest way to  get your results is to register for My Chart.   IF you received an x-ray today, you will receive an invoice from [facility]. Please contact [facility] at [phone] with questions or concerns regarding your invoice.   IF you received labwork today, you will receive an invoice from [facility]. Please contact [facility] at [phone] with questions or concerns regarding your invoice.   Our billing staff will not be able to assist you with questions regarding bills from these companies.  You will be contacted with the lab results as soon as they are available.  The fastest way to get your results is to activate your My Chart account. Instructions are located on the last page of this paperwork. If you have not heard from [place] regarding the results in 2 weeks, please contact this office.      Diabetes mellitus y nutricin, en adultos Diabetes Mellitus and Nutrition, Adult Si sufre de diabetes (diabetes mellitus), es muy importante tener hbitos alimenticios saludables debido a que sus niveles de Psychologist, counselling sangre (glucosa) se ven afectados en gran medida por lo que come y bebe. [place] alimentos saludables en las cantidades University, aproximadamente a la [employer], [place] ayudar a:  Scientist, physiological glucemia.  Disminuir el riesgo de sufrir una enfermedad cardaca.  Mejorar la presin arterial.  Barista o mantener un peso saludable. Todas las personas que sufren de diabetes son diferentes y cada una tiene necesidades diferentes en cuanto a un plan de alimentacin. El mdico puede recomendarle que trabaje con un especialista en dietas y nutricin (nutricionista) para Tax adviser plan para usted. Su plan de alimentacin puede variar segn factores como:  Las caloras que necesita.  Los medicamentos que toma.  Su peso.  Sus niveles de glucemia, presin arterial y colesterol.  Su nivel de [place].  Otras afecciones que tenga, como enfermedades cardacas  o renales. Cmo me afectan los carbohidratos? Los carbohidratos, o hidratos de carbono, afectan su nivel de glucemia ms que cualquier otro tipo de alimento. La ingesta de carbohidratos naturalmente aumenta la cantidad de [employer]. El recuento de carbohidratos es un mtodo destinado a Midwife un registro de la cantidad de carbohidratos que se consumen. El recuento de carbohidratos es importante para Pharmacologist la glucemia a un nivel saludable, especialmente si utiliza insulina o toma determinados medicamentos por va oral para la diabetes. Es importante conocer la cantidad de carbohidratos que se pueden ingerir en cada comida sin correr Surveyor, minerals. Esto es Government social research officer. Su nutricionista puede ayudarlo a calcular la cantidad de carbohidratos que debe ingerir en cada comida y en cada refrigerio. Entre los alimentos que contienen carbohidratos, se incluyen:  Pan, cereal, arroz, pastas y galletas.  Papas y maz.  Guisantes, frijoles y lentejas.  Leche y Dentist.  Nils Pyle y [place].  Postres, como pasteles, galletas, helado y caramelos. Cmo me afecta el alcohol? El alcohol puede provocar disminuciones sbitas de la glucemia (hipoglucemia), especialmente si utiliza insulina o toma determinados medicamentos por va oral para la diabetes. La hipoglucemia es una afeccin potencialmente mortal. Los sntomas de la hipoglucemia (somnolencia, mareos y confusin) son similares a los sntomas de haber consumido demasiado alcohol. Si el mdico afirma que el alcohol es seguro para usted, [place] estas pautas:  Limite el consumo de alcohol a no ms de por da si es mujer y no est [place], y a si es hombre. Una medida equivale a 12oz ( ) de cerveza, 5oz ( ) de vino o 1oz (68ml) de bebidas alcohlicas de alta graduacin.  No beba con el estmago vaco.  Mantngase hidratado bebiendo agua, refrescos dietticos o t helado sin azcar.  Tenga en cuenta que  los refrescos comunes, los jugos y otras bebida para Engineer, manufacturing pueden contener mucha azcar y se deben contar como carbohidratos. Cules son algunos consejos para seguir este plan?  Leer las etiquetas de los alimentos  Comience por leer el tamao de la porcin en la "Informacin nutricional" en las etiquetas de los alimentos envasados y las bebidas. La cantidad de caloras, carbohidratos, grasas y  otros nutrientes mencionados en la etiqueta se basan en una porcin del alimento. Muchos alimentos contienen ms de una porcin por envase.  Verifique la cantidad total de gramos (g) de carbohidratos totales en una porcin. Puede calcular la cantidad de porciones de carbohidratos al dividir el total de carbohidratos por 15. Por ejemplo, si un alimento tiene un total de 30g de carbohidratos, equivale a 2 porciones de carbohidratos.  Verifique la cantidad de gramos (g) de grasas saturadas y grasas trans en una porcin. Escoja alimentos que no contengan grasa o que tengan un bajo contenido.  Verifique la cantidad de miligramos (mg) de sal (sodio) en una porcin. La mayora de las personas deben limitar la ingesta de sodio total a menos de 2300mg  por .  Siempre consulte la informacin nutricional de los alimentos etiquetados como "con bajo contenido de grasa" o "sin grasa". Estos alimentos pueden tener un mayor contenido de Futures trader agregada o carbohidratos refinados, y deben evitarse.  Hable con su nutricionista para identificar sus objetivos diarios en cuanto a los nutrientes mencionados en la etiqueta. Al ir de compras  Evite comprar alimentos procesados, enlatados o precocinados. Estos alimentos tienden a International aid/development worker mayor cantidad de [place], sodio y azcar agregada.  Compre en la zona exterior de la tienda de comestibles. Esta zona incluye frutas y verduras frescas, granos a granel, carnes frescas y productos lcteos frescos. Al cocinar  Utilice mtodos de coccin a baja temperatura, como hornear, en  lugar de mtodos de coccin a alta temperatura, como frer en abundante aceite.  Cocine con aceites saludables, como el aceite de [place], canola o [place].  Evite cocinar con manteca, crema o carnes con alto contenido de grasa. Planificacin de las comidas  Coma las comidas y los refrigerios regularmente, preferentemente a la misma hora todos [place]. Evite pasar largos perodos de tiempo sin comer.  Consuma alimentos ricos en fibra, como frutas frescas, verduras, frijoles y cereales integrales. Consulte a su nutricionista sobre cuntas porciones de carbohidratos puede consumir en cada comida.  Consuma entre 4 y 6 onzas (oz) de protenas magras por da, como carnes [place], pollo, pescado, huevos o tofu. Una onza de protena magra equivale a: ? 1 onza de carne, pollo o pescado. ? 1huevo. ?  taza de tofu.  Coma algunos alimentos por da que contengan grasas saludables, como aguacates, frutos secos, semillas y pescado. Estilo de vida  Controle su nivel de glucemia con regularidad.  Haga actividad fsica habitualmente como se lo haya indicado el mdico. Esto puede incluir lo siguiente: ? [place] semanales de ejercicio de intensidad moderada o alta. Esto podra incluir caminatas dinmicas, ciclismo o gimnasia acutica. ? Realizar ejercicios de elongacin y de fortalecimiento, como yoga o levantamiento de pesas, por lo menos 2veces por semana.  Tome los se lo haya indicado el mdico.  No consuma ningn producto que contenga nicotina o tabaco, como cigarrillos y [employer]. Si necesita ayuda para dejar de fumar, consulte al Administrator, Civil Service con un asesor o instructor en diabetes para identificar estrategias para controlar el estrs y cualquier desafo emocional y social. Preguntas para hacerle al mdico  Es necesario que consulte a CIGNA en el cuidado de la diabetes?  Es necesario que me rena con un nutricionista?  A qu nmero puedo  llamar si tengo preguntas?  Cules son los mejores momentos para controlar la glucemia? Dnde encontrar ms informacin:  Asociacin Estadounidense de la Diabetes (American Diabetes Association): diabetes.org  Academia de Nutricin y IT trainer (Academy of  Nutrition and Dietetics): www.eatright.org  [employer] de la Diabetes y las Enfermedades Digestivas y [place] [facility] of Diabetes and Digestive and Kidney Diseases, NIH): CarFlippers.tn Resumen  Un plan de alimentacin saludable lo ayudar a Scientist, physiological glucemia y Pharmacologist un estilo de vida saludable.  Trabajar con un especialista en dietas y nutricin (nutricionista) puede ayudarlo a Designer, television/film set de alimentacin para usted.  Tenga en cuenta que los carbohidratos (hidratos de carbono) y el alcohol tienen efectos inmediatos en sus niveles de glucemia. Es importante contar los carbohidratos que ingiere y consumir alcohol con prudencia. Esta informacin no tiene Theme park manager el consejo del mdico. Asegrese de hacerle al mdico cualquier pregunta que tenga. Document Revised: 02/23/2017 Document Reviewed: 10/05/2016 Elsevier Patient Education  2020 Elsevier Inc.      Edwina Barth, MD [facility]

## 2019-10-25 NOTE — Patient Instructions (Addendum)
   If you have lab work done today you will be contacted with your lab results within the next 2 weeks.  If you have not heard from us then please contact us. The fastest way to get your results is to register for My Chart.   IF you received an x-ray today, you will receive an invoice from [facility]. Please contact [facility] at [phone] with questions or concerns regarding your invoice.   IF you received labwork today, you will receive an invoice from [facility]. Please contact [facility] at [phone] with questions or concerns regarding your invoice.   Our billing staff will not be able to assist you with questions regarding bills from these companies.  You will be contacted with the lab results as soon as they are available. The fastest way to get your results is to activate your My Chart account. Instructions are located on the last page of this paperwork. If you have not heard from us regarding the results in 2 weeks, please contact this office.     Diabetes mellitus y nutricin, en adultos Diabetes Mellitus and Nutrition, Adult Si sufre de diabetes (diabetes mellitus), es muy importante tener hbitos alimenticios saludables debido a que sus niveles de azcar en la sangre (glucosa) se ven afectados en gran medida por lo que come y bebe. [place]er alimentos saludables en las cantidades adecuadas, aproximadamente a la misma hora todos los das, lo ayudar a:  Controlar la glucemia.  Disminuir el riesgo de sufrir una enfermedad cardaca.  Mejorar la presin arterial.  Alcanzar o mantener un peso saludable. Todas las personas que sufren de diabetes son diferentes y cada una tiene necesidades diferentes en cuanto a un plan de alimentacin. El mdico puede recomendarle que trabaje con un especialista en dietas y nutricin (nutricionista) para elaborar el mejor plan para usted. Su plan de alimentacin puede variar segn factores como:  Las caloras que  necesita.  Los medicamentos que toma.  Su peso.  Sus niveles de glucemia, presin arterial y colesterol.  Su nivel de actividad.  Otras afecciones que tenga, como enfermedades cardacas o renales. Cmo me afectan los carbohidratos? Los carbohidratos, o hidratos de carbono, afectan su nivel de glucemia ms que cualquier otro tipo de alimento. La ingesta de carbohidratos naturalmente aumenta la cantidad de glucosa en la sangre. El recuento de carbohidratos es un mtodo destinado a llevar un registro de la cantidad de carbohidratos que se consumen. El recuento de carbohidratos es importante para mantener la glucemia a un nivel saludable, especialmente si utiliza insulina o toma determinados medicamentos por va oral para la diabetes. Es importante conocer la cantidad de carbohidratos que se pueden ingerir en cada comida sin correr ningn riesgo. Esto es diferente en cada persona. Su nutricionista puede ayudarlo a calcular la cantidad de carbohidratos que debe ingerir en cada comida y en cada refrigerio. Entre los alimentos que contienen carbohidratos, se incluyen:  Pan, cereal, arroz, pastas y galletas.  Papas y maz.  Guisantes, frijoles y lentejas.  Leche y yogur.  Frutas y jugo.  Postres, como pasteles, galletas, helado y caramelos. Cmo me afecta el alcohol? El alcohol puede provocar disminuciones sbitas de la glucemia (hipoglucemia), especialmente si utiliza insulina o toma determinados medicamentos por va oral para la diabetes. La hipoglucemia es una afeccin potencialmente mortal. Los sntomas de la hipoglucemia (somnolencia, mareos y confusin) son similares a los sntomas de haber consumido demasiado alcohol. Si el mdico afirma que el alcohol es seguro para usted, siga estas pautas:    Limite el consumo de alcohol a no ms de 1medida por da si es mujer y no est embarazada, y a 2medidas si es hombre. Una medida equivale a 12oz (355ml) de cerveza, 5oz (148ml) de vino o  1oz (44ml) de bebidas alcohlicas de alta graduacin.  No beba con el estmago vaco.  Mantngase hidratado bebiendo agua, refrescos dietticos o t helado sin azcar.  Tenga en cuenta que los refrescos comunes, los jugos y otras bebida para mezclar pueden contener mucha azcar y se deben contar como carbohidratos. Cules son algunos consejos para seguir este plan?  Leer las etiquetas de los alimentos  Comience por leer el tamao de la porcin en la "Informacin nutricional" en las etiquetas de los alimentos envasados y las bebidas. La cantidad de caloras, carbohidratos, grasas y otros nutrientes mencionados en la etiqueta se basan en una porcin del alimento. Muchos alimentos contienen ms de una porcin por envase.  Verifique la cantidad total de gramos (g) de carbohidratos totales en una porcin. Puede calcular la cantidad de porciones de carbohidratos al dividir el total de carbohidratos por 15. Por ejemplo, si un alimento tiene un total de 30g de carbohidratos, equivale a 2 porciones de carbohidratos.  Verifique la cantidad de gramos (g) de grasas saturadas y grasas trans en una porcin. Escoja alimentos que no contengan grasa o que tengan un bajo contenido.  Verifique la cantidad de miligramos (mg) de sal (sodio) en una porcin. La mayora de las personas deben limitar la ingesta de sodio total a menos de 2300mg por da.  Siempre consulte la informacin nutricional de los alimentos etiquetados como "con bajo contenido de grasa" o "sin grasa". Estos alimentos pueden tener un mayor contenido de azcar agregada o carbohidratos refinados, y deben evitarse.  Hable con su nutricionista para identificar sus objetivos diarios en cuanto a los nutrientes mencionados en la etiqueta. Al ir de compras  Evite comprar alimentos procesados, enlatados o precocinados. Estos alimentos tienden a tener una mayor cantidad de grasa, sodio y azcar agregada.  Compre en la zona exterior de la tienda de  comestibles. Esta zona incluye frutas y verduras frescas, granos a granel, carnes frescas y productos lcteos frescos. Al cocinar  Utilice mtodos de coccin a baja temperatura, como hornear, en lugar de mtodos de coccin a alta temperatura, como frer en abundante aceite.  Cocine con aceites saludables, como el aceite de oliva, canola o girasol.  Evite cocinar con manteca, crema o carnes con alto contenido de grasa. Planificacin de las comidas  Coma las comidas y los refrigerios regularmente, preferentemente a la misma hora todos los das. Evite pasar largos perodos de tiempo sin comer.  Consuma alimentos ricos en fibra, como frutas frescas, verduras, frijoles y cereales integrales. Consulte a su nutricionista sobre cuntas porciones de carbohidratos puede consumir en cada comida.  Consuma entre 4 y 6 onzas (oz) de protenas magras por da, como carnes magras, pollo, pescado, huevos o tofu. Una onza de protena magra equivale a: ? 1 onza de carne, pollo o pescado. ? 1huevo. ?  taza de tofu.  Coma algunos alimentos por da que contengan grasas saludables, como aguacates, frutos secos, semillas y pescado. Estilo de vida  Controle su nivel de glucemia con regularidad.  Haga actividad fsica habitualmente como se lo haya indicado el mdico. Esto puede incluir lo siguiente: ? 150minutos semanales de ejercicio de intensidad moderada o alta. Esto podra incluir caminatas dinmicas, ciclismo o gimnasia acutica. ? Realizar ejercicios de elongacin y de fortalecimiento, como yoga o levantamiento   de pesas, por lo menos 2veces por semana.  Tome los medicamentos como se lo haya indicado el mdico.  No consuma ningn producto que contenga nicotina o tabaco, como cigarrillos y cigarrillos electrnicos. Si necesita ayuda para dejar de fumar, consulte al mdico.  Trabaje con un asesor o instructor en diabetes para identificar estrategias para controlar el estrs y cualquier desafo emocional  y social. Preguntas para hacerle al mdico  Es necesario que consulte a un instructor en el cuidado de la diabetes?  Es necesario que me rena con un nutricionista?  A qu nmero puedo llamar si tengo preguntas?  Cules son los mejores momentos para controlar la glucemia? Dnde encontrar ms informacin:  Asociacin Estadounidense de la Diabetes (American Diabetes Association): diabetes.org  Academia de Nutricin y Diettica (Academy of Nutrition and Dietetics): www.eatright.org  Instituto Nacional de la Diabetes y las Enfermedades Digestivas y Renales (National Institute of Diabetes and Digestive and Kidney Diseases, NIH): www.niddk.nih.gov Resumen  Un plan de alimentacin saludable lo ayudar a controlar la glucemia y mantener un estilo de vida saludable.  Trabajar con un especialista en dietas y nutricin (nutricionista) puede ayudarlo a elaborar el mejor plan de alimentacin para usted.  Tenga en cuenta que los carbohidratos (hidratos de carbono) y el alcohol tienen efectos inmediatos en sus niveles de glucemia. Es importante contar los carbohidratos que ingiere y consumir alcohol con prudencia. Esta informacin no tiene como fin reemplazar el consejo del mdico. Asegrese de hacerle al mdico cualquier pregunta que tenga. Document Revised: 02/23/2017 Document Reviewed: 10/05/2016 Elsevier Patient Education  2020 Elsevier Inc.  

## 2019-10-26 LAB — CBC WITH DIFFERENTIAL/PLATELET
Basophils Absolute: 0.1 10*3/uL (ref 0.0–0.2)
Basos: 2 %
EOS (ABSOLUTE): 0.3 10*3/uL (ref 0.0–0.4)
Eos: 5 %
Hematocrit: 42.7 % (ref 37.5–51.0)
Hemoglobin: 14.2 g/dL (ref 13.0–17.7)
Immature Grans (Abs): 0 10*3/uL (ref 0.0–0.1)
Immature Granulocytes: 1 %
Lymphocytes Absolute: 1.3 10*3/uL (ref 0.7–3.1)
Lymphs: 24 %
MCH: 27.7 pg (ref 26.6–33.0)
MCHC: 33.3 g/dL (ref 31.5–35.7)
MCV: 83 fL (ref 79–97)
Monocytes Absolute: 0.5 10*3/uL (ref 0.1–0.9)
Monocytes: 8 %
Neutrophils Absolute: 3.3 10*3/uL (ref 1.4–7.0)
Neutrophils: 60 %
Platelets: 248 10*3/uL (ref 150–450)
RBC: 5.12 x10E6/uL (ref 4.14–5.80)
RDW: 13.1 % (ref 11.6–15.4)
WBC: 5.5 10*3/uL (ref 3.4–10.8)

## 2019-10-26 LAB — COMPREHENSIVE METABOLIC PANEL
ALT: 24 IU/L (ref 0–44)
AST: 23 IU/L (ref 0–40)
Albumin/Globulin Ratio: 1.8 (ref 1.2–2.2)
Albumin: 4.5 g/dL (ref 3.8–4.8)
Alkaline Phosphatase: 77 IU/L (ref 39–117)
BUN/Creatinine Ratio: 18 (ref 10–24)
BUN: 14 mg/dL (ref 8–27)
Bilirubin Total: 0.4 mg/dL (ref 0.0–1.2)
CO2: 21 mmol/L (ref 20–29)
Calcium: 9.2 mg/dL (ref 8.6–10.2)
Chloride: 103 mmol/L (ref 96–106)
Creatinine, Ser: 0.76 mg/dL (ref 0.76–1.27)
GFR calc Af Amer: 112 mL/min/{1.73_m2} (ref >59)
GFR calc non Af Amer: 97 mL/min/{1.73_m2} (ref >59)
Globulin, Total: 2.5 g/dL (ref 1.5–4.5)
Glucose: 142 mg/dL — ABNORMAL HIGH (ref 65–99)
Potassium: 4.6 mmol/L (ref 3.5–5.2)
Sodium: 141 mmol/L (ref 134–144)
Total Protein: 7 g/dL (ref 6.0–8.5)

## 2019-10-26 LAB — HEMOGLOBIN A1C
Est. average glucose Bld gHb Est-mCnc: 160 mg/dL
Hgb A1c MFr Bld: 7.2 % — ABNORMAL HIGH (ref 4.8–5.6)

## 2019-10-26 LAB — LIPID PANEL
Chol/HDL Ratio: 3.1 ratio (ref 0.0–5.0)
Cholesterol, Total: 178 mg/dL (ref 100–199)
HDL: 57 mg/dL (ref >39)
LDL Chol Calc (NIH): 101 mg/dL — ABNORMAL HIGH (ref 0–99)
Triglycerides: 114 mg/dL (ref 0–149)
VLDL Cholesterol Cal: 20 mg/dL (ref 5–40)

## 2019-12-26 ENCOUNTER — Other Ambulatory Visit: Payer: Self-pay

## 2019-12-26 ENCOUNTER — Encounter (HOSPITAL_COMMUNITY): Payer: Self-pay

## 2019-12-26 ENCOUNTER — Ambulatory Visit (HOSPITAL_COMMUNITY)
Admission: EM | Admit: 2019-12-26 | Discharge: 2019-12-26 | Disposition: A | Discharge status: Home / Self Care | Payer: 59 | Attending: Urgent Care | Admitting: Urgent Care

## 2019-12-26 DIAGNOSIS — H9202 Otalgia, left ear: Secondary | ICD-10-CM

## 2019-12-26 DIAGNOSIS — H6690 Otitis media, unspecified, unspecified ear: Secondary | ICD-10-CM

## 2019-12-26 DIAGNOSIS — M542 Cervicalgia: Secondary | ICD-10-CM

## 2019-12-26 MED ORDER — AMOXICILLIN 875 MG PO TABS: 875.0000 mg | ORAL_TABLET | Freq: Two times a day (BID) | ORAL | 0 refills | Status: DC

## 2019-12-26 NOTE — ED Triage Notes (Signed)
Pt reports not need translator for intake.   Pt states he started having left ear pain last night that goes down into his neck. Reports a throbbing ear ache that kept him from sleeping. Denies any other symptoms. Denies otc treatment.

## 2019-12-26 NOTE — ED Provider Notes (Signed)
[facility]   MRN: [id number] DOB: 11-Feb-1956  Subjective:   Donald Chang is a 64 y.o. male presenting for acute onset of left-sided ear pain, popping, radiation of the pain internally down his neck.  Patient is a diabetic, has good control.  Denies fever, eye pain, sinus pain, sore throat, cough, chest pain, shortness of breath.  No rashes.  Patient has longstanding atrial fibrillation, takes Xarelto.  No current facility-administered medications for this encounter.  Current Outpatient Medications:    GINSENG PO, Take by mouth. "ShotB" Ginseng 40.0 - Takes 1 capsule by mouth daily, Disp: , Rfl:    metFORMIN (GLUCOPHAGE) 1000 MG tablet, Take 1,000 mg by mouth 2 (two) times daily., Disp: , Rfl: 1   metoprolol succinate (TOPROL-XL) 25 MG 24 hr tablet, Take 25 mg by mouth daily., Disp: , Rfl: 0   rosuvastatin (CRESTOR) 10 MG tablet, Take 1 tablet (10 mg total) by mouth daily., Disp: 90 tablet, Rfl: 3   XARELTO 20 MG TABS tablet, Take 1 tablet by mouth daily., Disp: , Rfl: 4   No Known Allergies  Past Medical History:  Diagnosis Date   Diabetes mellitus without complication (HCC)    Hyperlipidemia    Hypertension      History reviewed. No pertinent surgical history.  Family History  Problem Relation Age of Onset   Healthy Mother    Hyperlipidemia Father    Hypertension Father    Diabetes Father     Social History   Tobacco Use   Smoking status: Never Smoker   Smokeless tobacco: Never Used  Substance Use Topics   Alcohol use: Yes    Comment: occas.   Drug use: No    ROS   Objective:   Vitals: BP 128/82    Pulse 99    Temp 98.2 F (36.8 C)    Resp 18    SpO2 99%   Physical Exam Constitutional:      General: He is not in acute distress.    Appearance: Normal appearance. He is well-developed and normal weight. He is not ill-appearing, toxic-appearing or diaphoretic.  HENT:     Head: Normocephalic and atraumatic.     Right Ear:  Tympanic membrane, ear canal and external ear normal. There is no impacted cerumen.     Left Ear: Ear canal and external ear normal. There is no impacted cerumen. Tympanic membrane is erythematous.     Nose: Nose normal. No congestion or rhinorrhea.     Mouth/Throat:     Mouth: Mucous membranes are moist.     Pharynx: Oropharynx is clear. No oropharyngeal exudate or posterior oropharyngeal erythema.  Eyes:     General: No scleral icterus.       Right eye: No discharge.        Left eye: No discharge.     Extraocular Movements: Extraocular movements intact.     Conjunctiva/sclera: Conjunctivae normal.     Pupils: Pupils are equal, round, and reactive to light.  Cardiovascular:     Rate and Rhythm: Normal rate.  Pulmonary:     Effort: Pulmonary effort is normal.  Musculoskeletal:     Cervical back: Normal range of motion and neck supple. No rigidity. No muscular tenderness.  Neurological:     General: No focal deficit present.     Mental Status: He is alert and oriented to person, place, and time.  Psychiatric:        Mood and Affect: Mood normal.  Behavior: Behavior normal.        Thought Content: Thought content normal.        Judgment: Judgment normal.     Assessment and Plan :   PDMP not reviewed this encounter.  1. Otalgia of left ear   2. Acute otitis media, unspecified otitis media type   3. Neck pain on left side     Start amoxicillin to cover for otitis media. Use supportive care otherwise. Counseled patient on potential for adverse effects with medications prescribed/recommended today, ER and return-to-clinic precautions discussed, patient verbalized understanding.    Wallis Bamberg, PA-C 12/26/19 1249

## 2019-12-26 NOTE — Discharge Instructions (Signed)
Tome 500mg -650mg  de Tylenol cada 6 horas para dolor, fiebre.

## 2020-02-19 ENCOUNTER — Ambulatory Visit: Payer: 59 | Admitting: Emergency Medicine

## 2020-02-20 ENCOUNTER — Encounter: Payer: Self-pay | Admitting: Emergency Medicine

## 2020-10-03 ENCOUNTER — Telehealth: Payer: Self-pay

## 2020-10-03 NOTE — Telephone Encounter (Signed)
Patient was called with the help of Medical Assistant Myna Hidalgo. He was informed of the reason for the call and scheduled for his appointment to see Dr. Domenick Bookbinder Dr. Salena Saner on 11/19/2020 at 1:40 PM. Patient was also given address to the office. Patient verbalized understanding and all (if any) questions were answered.

## 2020-10-03 NOTE — Telephone Encounter (Signed)
   [facility] Pre-operative Risk Assessment    Patient Name: Donald Chang  DOB: 05-Apr-1956  MRN: [id number]   [place]: - Please ensure there is not already an duplicate clearance open for this procedure. - Under Visit Info/Reason for Call, type in Other and utilize the format Clearance MM/DD/YY or Clearance TBD. Do not use dashes or single digits. - If request is for dental extraction, please clarify the # of teeth to be extracted.  Request for surgical clearance:  1. What type of surgery is being performed? Colonoscopy   2. When is this surgery scheduled? 12/25/20   3. What type of clearance is required (medical clearance vs. Pharmacy clearance to hold med vs. Both)? Both  4. Are there any medications that need to be held prior to surgery and how long? Xarelto    5. Practice name and name of physician performing surgery? [facility], Dr. Alessandra Bevels   6. What is the office phone number? [phone]   7.   What is the office fax number? [phone]  8.   Anesthesia type (None, local, MAC, general) ? Not listed   Jacqulynn Cadet 10/03/2020, 4:37 PM  _________________________________________________________________   (provider comments below)

## 2020-10-03 NOTE — Telephone Encounter (Signed)
Clinical pharmacist to review Xarelto 

## 2020-10-03 NOTE — Telephone Encounter (Signed)
   Patient Name: Donald Chang  DOB: 1955-10-16  MRN: [id number]   Primary Cardiologist: Thurmon Fair, MD  Chart reviewed as part of pre-operative protocol coverage. Because of [name]'s past medical history and time since last visit, he will require a follow-up visit in order to better assess preoperative cardiovascular risk.  Pre-op covering staff: - Please schedule appointment and call patient to inform them. If patient already had an upcoming appointment within acceptable timeframe, please add "pre-op clearance" to the appointment notes so provider is aware. - Please contact requesting surgeon's office via preferred method (i.e, phone, fax) to inform them of need for appointment prior to surgery.  If applicable, this message will also be routed to pharmacy pool and/or primary cardiologist for input on holding anticoagulant/antiplatelet agent as requested below so that this information is available to the clearing provider at time of patient's appointment.   Patient was last seen in November 2016 by Dr. Royann Shivers.  Technically considered a new patient.  [place], [place]  10/03/2020, 4:54 PM

## 2020-10-04 NOTE — Telephone Encounter (Signed)
Patient with diagnosis of afib on Xarelto for anticoagulation.    Procedure: colonosocpy Date of procedure: 12/25/20  CHA2DS2-VASc Score = 2  This indicates a 2.2% annual risk of stroke. The patient's score is based upon: CHF History: No HTN History: Yes Diabetes History: Yes Stroke History: No Vascular Disease History: No Age Score: 0 Gender Score: 0     CrCl 85 ml/min Platelet count 248  Per office protocol, patient can hold Xarelto for 1-2 days prior to procedure.    Patients medical hx reviewed from care everywhere. He has been seen recently by endocrinology and PCP. Has not seen cardiology in 5 years. If clinical status changes, clearance will need to be re-evaluated

## 2020-11-15 ENCOUNTER — Telehealth: Payer: Self-pay

## 2020-11-15 NOTE — Telephone Encounter (Signed)
   [facility] Pre-operative Risk Assessment    Patient Name: Jaxn Chiquito  DOB: Feb 20, 1956  MRN: [id number]    Request for surgical clearance:  1. What type of surgery is being performed Colonoscopy  2. When is this surgery scheduled 12/25/2020  3. What type of clearance is required  Pharmacy  4. Are there any medications that need to be held prior to surgery and how long Xarelto  5. Practice name and name of physician performing surgery [facility]  Dr.Brahmbhatt   6. What is the office phone number [phone]   7.   What is the office fax number [phone]  8.   Anesthesia type not listed   Neoma Laming 11/15/2020, 2:58 PM  _________________________________________________________________   (provider comments below)

## 2020-11-18 NOTE — Telephone Encounter (Signed)
Duplicate clearance - addressed 10/03/20 request. Final clearance to be given after pt is seen for new pt visit tomorrow by Dr Royann Shivers.

## 2020-11-19 ENCOUNTER — Other Ambulatory Visit: Payer: Self-pay

## 2020-11-19 ENCOUNTER — Encounter: Payer: Self-pay | Admitting: Cardiovascular Disease

## 2020-11-19 ENCOUNTER — Ambulatory Visit (INDEPENDENT_AMBULATORY_CARE_PROVIDER_SITE_OTHER): Payer: 59 | Admitting: Cardiovascular Disease

## 2020-11-19 VITALS — BP 122/72 | HR 71 | Ht 66.0 in | Wt 178.2 lb

## 2020-11-19 DIAGNOSIS — I4821 Permanent atrial fibrillation: Secondary | ICD-10-CM | POA: Diagnosis not present

## 2020-11-19 DIAGNOSIS — I10 Essential (primary) hypertension: Secondary | ICD-10-CM | POA: Diagnosis not present

## 2020-11-19 DIAGNOSIS — Z01810 Encounter for preprocedural cardiovascular examination: Secondary | ICD-10-CM

## 2020-11-19 DIAGNOSIS — E119 Type 2 diabetes mellitus without complications: Secondary | ICD-10-CM | POA: Diagnosis not present

## 2020-11-19 DIAGNOSIS — E782 Mixed hyperlipidemia: Secondary | ICD-10-CM

## 2020-11-19 NOTE — Patient Instructions (Signed)

## 2020-11-19 NOTE — Progress Notes (Signed)
Patient ID: Donald Chang, male   DOB: [birth year], 65 y.o.   MRN: [id number]      Cardiology Office Note   Date:  11/20/2020   ID:  [facility], DOB 02/20/1956, MRN [id number]  PCP:  Georgina Quint, MD  Cardiologist:   Thurmon Fair, MD   Chief Complaint  Patient presents with  . Atrial Fibrillation  . Pre-op Exam      History of Present Illness: Donald Chang is a 65 y.o. male who presents for  Follow-up for atrial fibrillation (long-term persistent, probably permanent) in the setting of well-controlled hypertension, hyperlipidemia and type 2 diabetes mellitus.  He is scheduled for a screening colonoscopy and is here for "clearance".  This is his first cardiology appointment since 2016.  He is done quite well and has no cardiovascular complaints whatsoever.  He no longer works in [employer], but can do heavy work around [employer] and in the yard without difficulty.  His atrial fibrillation was initially diagnosed when he presented with a sternal fracture after a fall, but retrospectively it is likely that this was already a chronic/permanent arrhythmia.  He had a severely dilated left atrium but really no other structural heart abnormalities and normal left ventricular systolic function.  He is never had signs or symptoms of heart failure.  He takes a low-dose of a beta-blocker for ventricular rate control and is on chronic Xarelto anticoagulation for stroke prevention.  He has never had a serious bleeding complication or an ischemic event that we are aware of.  The patient specifically denies any chest pain at rest exertion, dyspnea at rest or with exertion, orthopnea, paroxysmal nocturnal dyspnea, syncope, palpitations, focal neurological deficits, intermittent claudication, lower extremity edema, unexplained weight gain, cough, hemoptysis or wheezing.   Past Medical History:  Diagnosis Date  . Diabetes mellitus without complication (HCC)   . Hyperlipidemia   .  Hypertension     History reviewed. No pertinent surgical history.   Current Outpatient Medications  Medication Sig Dispense Refill  . metFORMIN (GLUCOPHAGE) 1000 MG tablet Take 1,000 mg by mouth 2 (two) times daily.  1  . metoprolol succinate (TOPROL-XL) 25 MG 24 hr tablet Take 25 mg by mouth daily.  0  . rosuvastatin (CRESTOR) 10 MG tablet Take 1 tablet (10 mg total) by mouth daily. 90 tablet 3  . XARELTO 20 MG TABS tablet Take 1 tablet by mouth daily.  4   No current facility-administered medications for this visit.    Allergies:   Patient has no known allergies.    Social History:  The patient  reports that he has never smoked. He has never used smokeless tobacco. He reports current alcohol use. He reports that he does not use drugs.   Family History:  The patient's  Family history is not well known  ROS:  Please see the history of present illness.    Otherwise, review of systems positive for none.   All other systems are reviewed and negative.    PHYSICAL EXAM: VS:  BP 122/72   Pulse 71   Ht 5\' 6"  (1.676 m)   Wt 178 lb 3.2 oz (80.8 kg)   SpO2 99%   BMI 28.76 kg/m  , BMI Body mass index is 28.76 kg/m.   General: Alert, oriented x3, no distress, appears well.  Overweight Head: no evidence of trauma, PERRL, EOMI, no exophtalmos or lid lag, no myxedema, no xanthelasma; normal ears, nose and oropharynx Neck: normal jugular venous pulsations and no hepatojugular  reflux; brisk carotid pulses without delay and no carotid bruits Chest: clear to auscultation, no signs of consolidation by percussion or palpation, normal fremitus, symmetrical and full respiratory excursions Cardiovascular: normal position and quality of the apical impulse, regular rhythm, normal first and second heart sounds, no murmurs, rubs or gallops Abdomen: no tenderness or distention, no masses by palpation, no abnormal pulsatility or arterial bruits, normal bowel sounds, no hepatosplenomegaly Extremities:  no clubbing, cyanosis or edema; 2+ radial, ulnar and brachial pulses bilaterally; 2+ right femoral, posterior tibial and dorsalis pedis pulses; 2+ left femoral, posterior tibial and dorsalis pedis pulses; no subclavian or femoral bruits Neurological: grossly nonfocal Psych: Normal mood and affect    EKG:  EKG is ordered today.  It shows coarse atrial fibrillation with good ventricular rate control.  There is occasional evidence of aberrant conduction.   Recent Labs: No results found for requested labs within last 8760 hours.  05/07/2020 TSH 6.67, hemoglobin 14.5 10/31/2020 creatinine 0.79, hemoglobin A1c 7.3%  Lipid Panel    Component Value Date/Time   CHOL 178 10/25/2019 1144   TRIG 114 10/25/2019 1144   HDL 57 10/25/2019 1144   CHOLHDL 3.1 10/25/2019 1144   LDLCALC 101 (H) 10/25/2019 1144   05/07/2020 cholesterol 137, HDL 55, LDL 47, triglycerides 217  Wt Readings from Last 3 Encounters:  11/19/20 178 lb 3.2 oz (80.8 kg)  10/25/19 174 lb 12.8 oz (79.3 kg)  05/07/15 169 lb (76.7 kg)      ASSESSMENT AND PLAN:  1. Permanent atrial fibrillation (HCC)   2. Essential hypertension   3. Type 2 diabetes mellitus without complication, without long-term current use of insulin (HCC)   4. Mixed hyperlipidemia   5. Preoperative cardiovascular examination     1. AFib: Permanent arrhythmia, well rate controlled on current beta-blocker dose.  On chronic anticoagulation without stroke or bleeding complications.  CHA2DS2-VASc 2 (hypertension, diabetes), at relatively low risk for stroke.  I would recommend that he can go ahead to colonoscopy by simply interrupting the anticoagulant for 2-3 days, without need for "bridging".  He should resume the anticoagulant based on the recommendations of his gastroenterologist, depending on whether or not he has complicated polyp removal or biopsies.  Also reminded him that he should have his renal function checked periodically to make sure that the current  dose of Xarelto remains the appropriate one. 2. HTN: Well-controlled. 3. DM: Fair control with most recent hemoglobin A1c 7.3%.  On glipizide and metformin therapy.  Weight loss would be beneficial.  If financially feasible, would benefit from switching from the sulfonylurea to either an SGLT2 inhibitor or a GLP-1 agonist, since these are associated with improved cardiovascular outcomes. 4. HLP: Most recent LDL cholesterol about 6 months ago was excellent at 47.  Triglycerides are borderline high but should improve with improved glycemic control. 5.  Preop exam: I believe that he is at low risk for major cardiac complications with the planned colonoscopy.  The anticoagulant can be interrupted for 2-3 days before the procedure.  Current medicines are reviewed at length with the patient today.  The patient does not have concerns regarding medicines.  The following changes have been made:  no change  Labs/ tests ordered today include:   Orders Placed This Encounter  Procedures  . EKG 12-Lead     Patient Instructions  Medication Instructions:  No changes *If you need a refill on your cardiac medications before your next appointment, please call your pharmacy*   Lab Work: None ordered  If you have labs (blood work) drawn today and your tests are completely normal, you will receive your results only by: Marland Kitchen MyChart Message (if you have MyChart) OR . A paper copy in the mail If you have any lab test that is abnormal or we need to change your treatment, we will call you to review the results.   Testing/Procedures: None ordered   Follow-Up: At [facility], you and your health needs are our priority.  As part of our continuing mission to provide you with exceptional heart care, we have created designated Provider Care Teams.  These Care Teams include your primary Cardiologist (physician) and Advanced Practice Providers (APPs -  Physician Assistants and Nurse Practitioners) who all work  together to provide you with the care you need, when you need it.  We recommend signing up for the patient portal called "MyChart".  Sign up information is provided on this After Visit Summary.  MyChart is used to connect with patients for Virtual Visits (Telemedicine).  Patients are able to view lab/test results, encounter notes, upcoming appointments, etc.  Non-urgent messages can be sent to your provider as well.   To learn more about what you can do with MyChart, go to [email or web address].    Your next appointment:   12 month(s)  The format for your next appointment:   In Person  Provider:   You may see Thurmon Fair, MD or one of the following Advanced Practice Providers on your designated Care Team:    Azalee Course, PA-C  Micah Flesher, [place] or   Judy Pimple, PA-C        Signed, Thurmon Fair, MD  11/20/2020 6:08 PM    Thurmon Fair, MD, [facility] [phone] office [phone] pager

## 2020-11-20 ENCOUNTER — Encounter: Payer: Self-pay | Admitting: Cardiovascular Disease

## 2020-11-21 NOTE — Telephone Encounter (Signed)
Chart reviewed as part of preop protocol. Already addressed in OV 5/24. Will remove from box.

## 2021-08-26 DIAGNOSIS — I4811 Longstanding persistent atrial fibrillation: Secondary | ICD-10-CM | POA: Diagnosis not present

## 2021-08-26 DIAGNOSIS — D6869 Other thrombophilia: Secondary | ICD-10-CM | POA: Diagnosis not present

## 2021-08-26 DIAGNOSIS — E1169 Type 2 diabetes mellitus with other specified complication: Secondary | ICD-10-CM | POA: Diagnosis not present

## 2021-08-26 DIAGNOSIS — E782 Mixed hyperlipidemia: Secondary | ICD-10-CM | POA: Diagnosis not present

## 2021-08-26 DIAGNOSIS — I10 Essential (primary) hypertension: Secondary | ICD-10-CM | POA: Diagnosis not present

## 2021-08-26 DIAGNOSIS — E039 Hypothyroidism, unspecified: Secondary | ICD-10-CM | POA: Diagnosis not present

## 2022-02-20 ENCOUNTER — Ambulatory Visit: Payer: Self-pay

## 2022-02-20 NOTE — Patient Outreach (Signed)
  Care Coordination   Initial Visit Note   02/20/2022 Name: Annie Consolo MRN: [id number] DOB: 1956/05/06  Kaii Cam is a 66 y.o. year old male who sees Sagardia, Eilleen Kempf, MD for primary care. I spoke with  [facility] by phone today.  What matters to the patients health and wellness today?  No Concerns     SDOH assessments and interventions completed:  Yes  SDOH Interventions Today    Flowsheet Row Most Recent Value  SDOH Interventions   Food Insecurity Interventions Intervention Not Indicated  Transportation Interventions Intervention Not Indicated        Care Coordination Interventions Activated:  No  Care Coordination Interventions:  No, not indicated   Follow up plan: No further intervention required.   Encounter Outcome:  Patient Visit Completed    Katha Cabal [facility] Care Management [phone]

## 2022-02-27 DIAGNOSIS — I10 Essential (primary) hypertension: Secondary | ICD-10-CM | POA: Diagnosis not present

## 2022-02-27 DIAGNOSIS — D6869 Other thrombophilia: Secondary | ICD-10-CM | POA: Diagnosis not present

## 2022-02-27 DIAGNOSIS — E782 Mixed hyperlipidemia: Secondary | ICD-10-CM | POA: Diagnosis not present

## 2022-02-27 DIAGNOSIS — E039 Hypothyroidism, unspecified: Secondary | ICD-10-CM | POA: Diagnosis not present

## 2022-02-27 DIAGNOSIS — G4709 Other insomnia: Secondary | ICD-10-CM | POA: Diagnosis not present

## 2022-02-27 DIAGNOSIS — E559 Vitamin D deficiency, unspecified: Secondary | ICD-10-CM | POA: Diagnosis not present

## 2022-02-27 DIAGNOSIS — I4811 Longstanding persistent atrial fibrillation: Secondary | ICD-10-CM | POA: Diagnosis not present

## 2022-02-27 DIAGNOSIS — Z0000 Encounter for general adult medical examination without abnormal findings: Secondary | ICD-10-CM | POA: Diagnosis not present

## 2022-02-27 DIAGNOSIS — E1169 Type 2 diabetes mellitus with other specified complication: Secondary | ICD-10-CM | POA: Diagnosis not present

## 2022-02-27 LAB — CBC AND DIFFERENTIAL
HCT: 41 (ref 41–53)
Hemoglobin: 13.6 (ref 13.5–17.5)
Platelets: 212 10*3/uL (ref 150–400)
WBC: 7.6

## 2022-02-27 LAB — LIPID PANEL
Cholesterol: 129 (ref 0–200)
HDL: 53 (ref 35–70)
LDL Cholesterol: 50
LDl/HDL Ratio: 2.4
Triglycerides: 155 (ref 40–160)

## 2022-02-27 LAB — CBC: RBC: 5 (ref 3.87–5.11)

## 2022-02-27 LAB — BASIC METABOLIC PANEL
BUN: 15 (ref 4–21)
CO2: 28 — AB (ref 13–22)
Chloride: 105 (ref 99–108)
Creatinine: 0.8 (ref 0.6–1.3)
Glucose: 115
Potassium: 4.6 mEq/L (ref 3.5–5.1)
Sodium: 140 (ref 137–147)

## 2022-02-27 LAB — HEPATIC FUNCTION PANEL
ALT: 28 U/L (ref 10–40)
AST: 26 (ref 14–40)
Bilirubin, Total: 0.4

## 2022-02-27 LAB — PSA: PSA: 0.24

## 2022-02-27 LAB — TSH: TSH: 5.22 (ref 0.41–5.90)

## 2022-02-27 LAB — HEMOGLOBIN A1C: Hemoglobin A1C: 8.1

## 2022-02-27 LAB — COMPREHENSIVE METABOLIC PANEL
Albumin: 4.4 (ref 3.5–5.0)
Calcium: 9.2 (ref 8.7–10.7)

## 2022-02-27 LAB — MICROALBUMIN / CREATININE URINE RATIO: Microalb Creat Ratio: 9.3

## 2022-02-27 LAB — MICROALBUMIN, URINE: Microalb, Ur: 1.06

## 2022-02-27 LAB — PROTEIN / CREATININE RATIO, URINE: Creatinine, Urine: 114

## 2022-02-27 LAB — VITAMIN D 25 HYDROXY (VIT D DEFICIENCY, FRACTURES): Vit D, 25-Hydroxy: 33.5

## 2022-03-05 DIAGNOSIS — R21 Rash and other nonspecific skin eruption: Secondary | ICD-10-CM | POA: Diagnosis not present

## 2022-03-21 DIAGNOSIS — U071 COVID-19: Secondary | ICD-10-CM | POA: Diagnosis not present

## 2022-04-28 ENCOUNTER — Ambulatory Visit (INDEPENDENT_AMBULATORY_CARE_PROVIDER_SITE_OTHER): Payer: Medicare Other | Admitting: Nurse Practitioner

## 2022-04-28 ENCOUNTER — Encounter: Payer: Self-pay | Admitting: Nurse Practitioner

## 2022-04-28 ENCOUNTER — Telehealth: Payer: Self-pay | Admitting: Nurse Practitioner

## 2022-04-28 VITALS — BP 132/82 | HR 89 | Temp 97.2°F | Resp 14 | Ht 66.0 in | Wt 177.5 lb

## 2022-04-28 DIAGNOSIS — H539 Unspecified visual disturbance: Secondary | ICD-10-CM | POA: Diagnosis not present

## 2022-04-28 DIAGNOSIS — I4811 Longstanding persistent atrial fibrillation: Secondary | ICD-10-CM | POA: Diagnosis not present

## 2022-04-28 DIAGNOSIS — E1169 Type 2 diabetes mellitus with other specified complication: Secondary | ICD-10-CM

## 2022-04-28 DIAGNOSIS — H6993 Unspecified Eustachian tube disorder, bilateral: Secondary | ICD-10-CM | POA: Insufficient documentation

## 2022-04-28 DIAGNOSIS — E785 Hyperlipidemia, unspecified: Secondary | ICD-10-CM | POA: Diagnosis not present

## 2022-04-28 DIAGNOSIS — E1159 Type 2 diabetes mellitus with other circulatory complications: Secondary | ICD-10-CM

## 2022-04-28 DIAGNOSIS — Z7901 Long term (current) use of anticoagulants: Secondary | ICD-10-CM

## 2022-04-28 DIAGNOSIS — R3911 Hesitancy of micturition: Secondary | ICD-10-CM

## 2022-04-28 DIAGNOSIS — E663 Overweight: Secondary | ICD-10-CM

## 2022-04-28 DIAGNOSIS — I152 Hypertension secondary to endocrine disorders: Secondary | ICD-10-CM

## 2022-04-28 DIAGNOSIS — E66811 Obesity, class 1: Secondary | ICD-10-CM | POA: Insufficient documentation

## 2022-04-28 DIAGNOSIS — E669 Obesity, unspecified: Secondary | ICD-10-CM

## 2022-04-28 MED ORDER — TAMSULOSIN HCL 0.4 MG PO CAPS: 0.4000 mg | ORAL_CAPSULE | Freq: Every day | ORAL | 1 refills | Status: DC

## 2022-04-28 MED ORDER — FLUTICASONE PROPIONATE 50 MCG/ACT NA SUSP: 2.0000 | Freq: Every day | NASAL | 0 refills | Status: DC

## 2022-04-28 NOTE — Assessment & Plan Note (Signed)
We will start patient on Flonase 2 nasal sprays daily.  Epistaxis precautions reviewed

## 2022-04-28 NOTE — Assessment & Plan Note (Signed)
Having urinary hesitancy and see most of the morning.  Last PSA was within normal limits this will be abstracted.  We will start tamsulosin 0.4 mg daily.

## 2022-04-28 NOTE — Telephone Encounter (Signed)
Say patient in office today. He was under the impression he was seeing you. States that he former provider referred him to you. He was curious if you would take him on as a patient. I told him I would have to ask. He does speak spanish and we were able to do the visit with the video interpreter

## 2022-04-28 NOTE — Progress Notes (Signed)
Established Patient Office Visit  Subjective   Patient ID: Donald Chang, male    DOB: 12-Mar-1956  Age: 66 y.o. MRN: [id number]  Chief Complaint  Patient presents with   Establish Care    Previous PCP [facility], and then Dr Ron Agee in [facility]   Decreased Visual Acuity    In the last 2 months has not been able to see well like he did before. No eye exam in the last 12 months.    HPI  Video interpreter: Earlyne Iba [phone]  DM2: patient is currently on glipizide 10mg  and metformin 1000mg  Checks sugar at home infrequently. Once a week. 109 was the sugar before last at home and the last time was 149, first thing in the morning    HTN: Patient currently maintained on metoprolol. Does not check blood pressure at home.   HLD Patient currently maintained on crestor and omega 3  Diet: one meal a day. States that he snacks in the evening. Will drink coffee in the am with a biscuit. Water. Will drink diet Dr. several times a week.   Exercise: states that he walks daily with his son. 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 States it has been 3 or so years since seeing cardiology. States last doctor checked it out and said it was ok   Vision: Change over the past two months. No recent eye exam. States that his wife has an infection in her stomach and had surgery approx 1 week ago. States that he has not been able to sleep well due to her being sick and and checking on her.  He is curious if this has any correlation with his symptoms.  Last time he was seen by an eye professional was over 1 year ago and was in [place].  He does have glasses that he uses for reading  Ear problem: States that he has a ringing in the ears. States it stated 2 weeks ago. States that he will use alchol and it will help for a couple days. States that yesterday he put alochol and feels a little better today. No objects in the ear or loud noise exposures  recently     Review of Systems  Constitutional:  Negative for chills and fever.  Respiratory:  Negative for shortness of breath.   Cardiovascular:  Negative for chest pain.  Gastrointestinal:  Negative for abdominal pain, nausea and vomiting.       BM daily  Genitourinary:  Negative for dysuria and hematuria.       Weak stream with urinating   Neurological:  Negative for tingling and headaches.  Psychiatric/Behavioral:  Negative for suicidal ideas.    Diabetic Foot Form - Detailed   Diabetic Foot Exam - detailed Diabetic Foot exam was performed with the following findings: Yes   Is there swelling or and abnormal foot shape?: No Is there a claw toe deformity?: No Is there elevated skin temparature?: No Pulse Foot Exam completed.: Yes   Right posterior Tibialias: Present Left posterior Tibialias: Present   Right Dorsalis Pedis: Present Left Dorsalis Pedis: Present  Sensory Foot Exam Completed.: Yes Semmes-Weinstein Monofilament Test   Comments: All 10 sites on bilateral feet has sensation intact        Objective:     BP 132/82   Pulse 89   Temp (!) 97.2 F (36.2 C) (Temporal)   Resp 14   Ht 5\' 6"  (1.676 m)  Wt 177 lb 8 oz (80.5 kg)   SpO2 99%   BMI 28.65 kg/m    Physical Exam Vitals and nursing note reviewed.  Constitutional:      Appearance: Normal appearance.  HENT:     Right Ear: Ear canal and external ear normal.     Left Ear: Ear canal and external ear normal.     Mouth/Throat:     Mouth: Mucous membranes are moist.     Pharynx: Oropharynx is clear.  Eyes:     Extraocular Movements: Extraocular movements intact.     Pupils: Pupils are equal, round, and reactive to light.  Cardiovascular:     Rate and Rhythm: Normal rate and regular rhythm.     Pulses:          Dorsalis pedis pulses are 2+ on the right side and 2+ on the left side.       Posterior tibial pulses are 2+ on the right side and 2+ on the left side.     Heart sounds: Normal heart  sounds.  Pulmonary:     Effort: Pulmonary effort is normal.     Breath sounds: Normal breath sounds.  Abdominal:     General: Bowel sounds are normal. There is no distension.     Palpations: There is no mass.     Tenderness: There is no abdominal tenderness.     Hernia: No hernia is present.  Musculoskeletal:     Right lower leg: No edema.     Left lower leg: No edema.  Feet:     Right foot:     Skin integrity: Skin integrity normal.     Left foot:     Skin integrity: Skin integrity normal.     Comments: Right second toe does lay slightly on top of great toe Lymphadenopathy:     Cervical: No cervical adenopathy.  Neurological:     Mental Status: He is alert.      No results found for any visits on 04/28/22.    The 10-year ASCVD risk score (Arnett DK, et al., 2019) is: 26%    Assessment & Plan:   Problem List Items Addressed This Visit       Cardiovascular and Mediastinum   Longstanding persistent atrial fibrillation (HCC)    Patient currently maintained on metoprolol and Xarelto.  Rate controlled in office.  Continue medication as prescribed      Hypertension associated with diabetes [facility])    Patient currently maintained on metoprolol.  Blood pressure within normal limits today.      Relevant Medications   glipiZIDE (GLUCOTROL XL) 10 MG 24 hr tablet     Endocrine   Diabetes mellitus type 2 in obese (HCC) - Primary    Patient only maintained on glipizide 10 mg ER twice daily and metformin 1000 mg twice daily.  Last A1c was 8.1% this was checked in September through her previous clinic.  Patient did bring in a print out of all his lab results these will be abstracted in in his chart.  Did encourage healthy lifestyle modifications we will make no change currently on his antidiabetic medications.  Patient would likely be a good candidate for GLP-1 as he is overweight      Relevant Medications   glipiZIDE (GLUCOTROL XL) 10 MG 24 hr tablet     Nervous and Auditory    Eustachian tube dysfunction, bilateral    We will start patient on Flonase 2 nasal sprays daily.  Epistaxis precautions  reviewed      Relevant Medications   fluticasone (FLONASE) 50 MCG/ACT nasal spray     Other   Current use of long term anticoagulation    Xarelto for prevention of blood clots secondary to atrial fibrillation.      Dyslipidemia    Patient currently maintained on rosuvastatin.  Continue working lifestyle applications.  Continue taking medication as prescribed      Overweight   Change in vision    Exam benign in office.  Did recommend patient see ophthalmology for detailed exam given history of diabetes and high blood pressure.  Referral placed today      Relevant Orders   Ambulatory referral to Ophthalmology   Urinary hesitancy    Having urinary hesitancy and see most of the morning.  Last PSA was within normal limits this will be abstracted.  We will start tamsulosin 0.4 mg daily.      Relevant Medications   tamsulosin (FLOMAX) 0.4 MG CAPS capsule    Return in about 2 months (around 06/28/2022) for Dm recheck .    Romilda Garret, NP

## 2022-04-28 NOTE — Assessment & Plan Note (Signed)
Patient only maintained on glipizide 10 mg ER twice daily and metformin 1000 mg twice daily.  Last A1c was 8.1% this was checked in September through her previous clinic.  Patient did bring in a print out of all his lab results these will be abstracted in in his chart.  Did encourage healthy lifestyle modifications we will make no change currently on his antidiabetic medications.  Patient would likely be a good candidate for GLP-1 as he is overweight

## 2022-04-28 NOTE — Assessment & Plan Note (Signed)
Patient currently maintained on metoprolol.  Blood pressure within normal limits today.

## 2022-04-28 NOTE — Assessment & Plan Note (Signed)
Xarelto for prevention of blood clots secondary to atrial fibrillation.

## 2022-04-28 NOTE — Assessment & Plan Note (Signed)
Patient currently maintained on rosuvastatin.  Continue working lifestyle applications.  Continue taking medication as prescribed

## 2022-04-28 NOTE — Patient Instructions (Signed)
Nice to see you today I will see you in 2 months for a sugar check If you can check your sugar daily that would be good I will ask and see if Dr. Danise Mina will take on new patients, but I will have to approve it first

## 2022-04-28 NOTE — Assessment & Plan Note (Signed)
Exam benign in office.  Did recommend patient see ophthalmology for detailed exam given history of diabetes and high blood pressure.  Referral placed today

## 2022-04-28 NOTE — Assessment & Plan Note (Signed)
Patient currently maintained on metoprolol and Xarelto.  Rate controlled in office.  Continue medication as prescribed

## 2022-04-29 ENCOUNTER — Encounter: Payer: Self-pay | Admitting: Family Medicine

## 2022-05-01 NOTE — Telephone Encounter (Addendum)
Unfortunately I am closed to new patients.  I have full confidence in Matt's care.

## 2022-05-04 NOTE — Telephone Encounter (Signed)
Called patient on both numbers with interpreter and patient kept hanging up. I am sending patient a letter.

## 2022-05-04 NOTE — Telephone Encounter (Signed)
We can let the patient know that I am willing and happy to continue taking care of him

## 2022-05-20 ENCOUNTER — Other Ambulatory Visit: Payer: Self-pay | Admitting: Nurse Practitioner

## 2022-05-20 DIAGNOSIS — R3911 Hesitancy of micturition: Secondary | ICD-10-CM

## 2022-05-20 DIAGNOSIS — H6993 Unspecified Eustachian tube disorder, bilateral: Secondary | ICD-10-CM

## 2022-05-20 NOTE — Telephone Encounter (Signed)
Called patient no answer and voicemail not set up. Spoke with Mrs Bradly Bienenstock, patient's wife-ok per DPR on file, with interpreter, [place] and he took his medication with him but she is not sure if patient will see another provider while away for refills. She thinks medication was helpful. Advised to let patient know to get in touch with [place] whenever he is back and when needs refills. Not sure when he will be back.

## 2022-06-26 ENCOUNTER — Other Ambulatory Visit: Payer: Self-pay | Admitting: Nurse Practitioner

## 2022-06-26 DIAGNOSIS — R3911 Hesitancy of micturition: Secondary | ICD-10-CM

## 2022-07-02 ENCOUNTER — Encounter: Payer: Self-pay | Admitting: Nurse Practitioner

## 2022-07-02 ENCOUNTER — Ambulatory Visit (INDEPENDENT_AMBULATORY_CARE_PROVIDER_SITE_OTHER): Payer: Medicare Other | Admitting: Nurse Practitioner

## 2022-07-02 VITALS — BP 128/82 | HR 104 | Ht 66.0 in | Wt 179.0 lb

## 2022-07-02 DIAGNOSIS — H539 Unspecified visual disturbance: Secondary | ICD-10-CM | POA: Diagnosis not present

## 2022-07-02 DIAGNOSIS — R3911 Hesitancy of micturition: Secondary | ICD-10-CM | POA: Diagnosis not present

## 2022-07-02 DIAGNOSIS — E669 Obesity, unspecified: Secondary | ICD-10-CM | POA: Diagnosis not present

## 2022-07-02 DIAGNOSIS — E1169 Type 2 diabetes mellitus with other specified complication: Secondary | ICD-10-CM

## 2022-07-02 DIAGNOSIS — Z79899 Other long term (current) drug therapy: Secondary | ICD-10-CM | POA: Diagnosis not present

## 2022-07-02 DIAGNOSIS — R109 Unspecified abdominal pain: Secondary | ICD-10-CM | POA: Diagnosis not present

## 2022-07-02 DIAGNOSIS — Z0000 Encounter for general adult medical examination without abnormal findings: Secondary | ICD-10-CM | POA: Insufficient documentation

## 2022-07-02 LAB — POCT GLYCOSYLATED HEMOGLOBIN (HGB A1C): Hemoglobin A1C: 7.5 % — AB (ref 4.0–5.6)

## 2022-07-02 MED ORDER — RIVAROXABAN 20 MG PO TABS: 20.0000 mg | ORAL_TABLET | Freq: Every day | ORAL | 4 refills | Status: DC

## 2022-07-02 NOTE — Progress Notes (Signed)
Established Patient Office Visit  Subjective   Patient ID: Donald Chang, male    DOB: 05/06/1956  Age: 67 y.o. MRN: [id number]  Chief Complaint  Patient presents with   Diabetes      DM: currently on Metformin and gliizide. Does not check his sugars oftent at home Last A1C was 8.1% at previous PCP office  States 137 first thing   Urinary hesitancy: Patient was started on flomax at last office visit. States that the medicaoitn has helped    Change in vision: was referred to opthal. States that he does have an appt but he is not sure when. States that 3-4 months ago he started having blurry vision    Trouble sleeping: states it has been going on for approx 6 months. Has trouble going to sleep. He did try melatonin in the past that did help but he did not like taking it every night as he did not want to become dependent on the substance.  Patient states that when having difficulty going to sleep he cannot get his mind off.  Medication concerns: Patient brought in a vitamin pack from [facility] for men and had questions in regards to using medication/supplement did have fish oils and turmeric in it consulted with pharmacy and discouraged use as patient is on anticoagulation.    Review of Systems  Constitutional:  Negative for chills and fever.  Respiratory:  Negative for shortness of breath.   Cardiovascular:  Negative for chest pain and leg swelling.  Psychiatric/Behavioral:  The patient has insomnia.       Objective:     BP 128/82   Pulse (!) 104   Ht 5\' 6"  (1.676 m)   Wt 179 lb (81.2 kg)   SpO2 100%   BMI 28.89 kg/m  BP Readings from Last 3 Encounters:  07/02/22 128/82  04/28/22 132/82  11/19/20 122/72   Wt Readings from Last 3 Encounters:  07/02/22 179 lb (81.2 kg)  04/28/22 177 lb 8 oz (80.5 kg)  11/19/20 178 lb 3.2 oz (80.8 kg)      Physical Exam Vitals and nursing note reviewed.  Constitutional:      Appearance: Normal appearance.  Cardiovascular:      Rate and Rhythm: Normal rate and regular rhythm.     Pulses:          Dorsalis pedis pulses are 2+ on the right side and 2+ on the left side.       Posterior tibial pulses are 2+ on the right side and 2+ on the left side.  Pulmonary:     Breath sounds: Normal breath sounds.  Abdominal:     General: Bowel sounds are normal. There is no distension.     Palpations: There is no mass.     Tenderness: There is no abdominal tenderness.     Hernia: No hernia is present.  Musculoskeletal:     Right lower leg: No edema.     Left lower leg: No edema.  Feet:     Right foot:     Skin integrity: Skin integrity normal.     Toenail Condition: Right toenails are normal.     Left foot:     Skin integrity: Skin integrity normal.     Toenail Condition: Left toenails are normal.  Neurological:     Mental Status: He is alert.      Results for orders placed or performed in visit on 07/02/22  POCT glycosylated hemoglobin (Hb A1C)  Result Value  Ref Range   Hemoglobin A1C 7.5 (A) 4.0 - 5.6 %   HbA1c POC (<> result, manual entry)     HbA1c, POC (prediabetic range)     HbA1c, POC (controlled diabetic range)        The ASCVD Risk score (Arnett DK, et al., 2019) failed to calculate for the following reasons:   The valid total cholesterol range is 130 to 320 mg/dL    Assessment & Plan:   Problem List Items Addressed This Visit       Endocrine   Diabetes mellitus type 2 in obese ([place]) - Primary    Patient's A1c has trended down.  7.5 today office A1c previous to that was 8.1.  States he does have some days that he does not take glipizide 2 times a day.  Informed patient to take medication as prescribed prior to makiing a change in medication.      Relevant Orders   POCT glycosylated hemoglobin (Hb A1C) (Completed)     Other   Change in vision    Patient still experiencing some blurred vision.  He was referred to ophthalmology and has an appointment coming up.  Encouraged him to keep  appointment with ophthalmologist      Urinary hesitancy    Patient has been taking tamsulosin 0.4 mg as prescribed states his symptoms have improved.  Continue medication as prescribed      Stomach cramps    Ambiguous in nature in the periumbilical area happens sometimes after he eats.  No nausea no other symptoms no tenderness to palpation.  Patient does still have gallbladder.  Patient to continue to monitor follow-up with worsening with pain or symptoms.      Medication management    Patient brought a vitamin pack from a local fitness store.  After review ingredients and consulting with pharmacy in office discouraged patient to use it as it does have fish oil and turmeric which can increase his bleed time and he is already on Xarelto.       Return in about 3 months (around 10/01/2022) for DM recheck .    Romilda Garret, NP

## 2022-07-02 NOTE — Assessment & Plan Note (Signed)
Patient still experiencing some blurred vision.  He was referred to ophthalmology and has an appointment coming up.  Encouraged him to keep appointment with ophthalmologist

## 2022-07-02 NOTE — Assessment & Plan Note (Signed)
Ambiguous in nature in the periumbilical area happens sometimes after he eats.  No nausea no other symptoms no tenderness to palpation.  Patient does still have gallbladder.  Patient to continue to monitor follow-up with worsening with pain or symptoms.

## 2022-07-02 NOTE — Patient Instructions (Signed)
Nice to see you today You can get over the counter melatonin 3mg  to take at bedtime as needed to help you sleep. I do NOT recommend that you take that vitamin pack  I want to see you in 3 months I want you to take the glipizide twice a day

## 2022-07-02 NOTE — Assessment & Plan Note (Signed)
Patient brought a vitamin pack from a local fitness store.  After review ingredients and consulting with pharmacy in office discouraged patient to use it as it does have fish oil and turmeric which can increase his bleed time and he is already on Xarelto.

## 2022-07-02 NOTE — Assessment & Plan Note (Signed)
Patient has been taking tamsulosin 0.4 mg as prescribed states his symptoms have improved.  Continue medication as prescribed

## 2022-07-02 NOTE — Assessment & Plan Note (Addendum)
Patient's A1c has trended down.  7.5 today office A1c previous to that was 8.1.  States he does have some days that he does not take glipizide 2 times a day.  Informed patient to take medication as prescribed prior to makiing a change in medication.

## 2022-09-18 ENCOUNTER — Telehealth: Payer: Self-pay | Admitting: Nurse Practitioner

## 2022-09-18 NOTE — Telephone Encounter (Signed)
Prescription Request  09/18/2022  LOV: 07/02/2022  What is the name of the medication or equipment? rivaroxaban (XARELTO) 20 MG TABS tablet   Have you contacted your pharmacy to request a refill? Yes (Patient is out of medication)  Which pharmacy would you like this sent to?  [id number] [address] [place] [address] Phone: [phone] Fax: [phone]    Patient notified that their request is being sent to the clinical staff for review and that they should receive a response within 2 business days.   Please advise at Mobile [phone] (mobile)

## 2022-09-18 NOTE — Telephone Encounter (Signed)
Called pt and advised him to talk to pharmacy. Sent is last for #30 with 4 refills.

## 2022-10-01 ENCOUNTER — Encounter: Payer: Self-pay | Admitting: Nurse Practitioner

## 2022-10-01 ENCOUNTER — Ambulatory Visit: Payer: Medicare Other | Admitting: Nurse Practitioner

## 2022-10-19 ENCOUNTER — Other Ambulatory Visit: Payer: Self-pay | Admitting: Nurse Practitioner

## 2022-10-19 ENCOUNTER — Telehealth: Payer: Self-pay | Admitting: Nurse Practitioner

## 2022-10-19 DIAGNOSIS — I480 Paroxysmal atrial fibrillation: Secondary | ICD-10-CM

## 2022-10-19 MED ORDER — RIVAROXABAN 20 MG PO TABS
20.0000 mg | ORAL_TABLET | Freq: Every day | ORAL | 5 refills | Status: DC
Start: 2022-10-19 — End: 2023-09-15

## 2022-10-19 NOTE — Telephone Encounter (Signed)
Prescription Request  10/19/2022  LOV: 07/02/2022  What is the name of the medication or equipment?  rivaroxaban rivaroxaban (XARELTO) 20 MG TABS tablet  Have you contacted your pharmacy to request a refill? No   Which pharmacy would you like this sent to?  [id number] [place], [place] - [address] [place] [address] Phone: [phone] Fax: [phone]    Patient notified that their request is being sent to the clinical staff for review and that they should receive a response within 2 business days.   Please advise at [phone]

## 2022-10-19 NOTE — Telephone Encounter (Signed)
Refill sent in

## 2022-11-05 ENCOUNTER — Encounter: Payer: Self-pay | Admitting: Nurse Practitioner

## 2022-11-05 ENCOUNTER — Ambulatory Visit (INDEPENDENT_AMBULATORY_CARE_PROVIDER_SITE_OTHER): Payer: Medicare Other | Admitting: Nurse Practitioner

## 2022-11-05 VITALS — BP 136/82 | HR 76 | Temp 98.1°F | Resp 16 | Ht 66.0 in | Wt 179.2 lb

## 2022-11-05 DIAGNOSIS — H6993 Unspecified Eustachian tube disorder, bilateral: Secondary | ICD-10-CM

## 2022-11-05 DIAGNOSIS — I152 Hypertension secondary to endocrine disorders: Secondary | ICD-10-CM | POA: Diagnosis not present

## 2022-11-05 DIAGNOSIS — E1169 Type 2 diabetes mellitus with other specified complication: Secondary | ICD-10-CM

## 2022-11-05 DIAGNOSIS — E669 Obesity, unspecified: Secondary | ICD-10-CM

## 2022-11-05 DIAGNOSIS — Z7984 Long term (current) use of oral hypoglycemic drugs: Secondary | ICD-10-CM

## 2022-11-05 DIAGNOSIS — I480 Paroxysmal atrial fibrillation: Secondary | ICD-10-CM | POA: Diagnosis not present

## 2022-11-05 DIAGNOSIS — E039 Hypothyroidism, unspecified: Secondary | ICD-10-CM

## 2022-11-05 DIAGNOSIS — H6992 Unspecified Eustachian tube disorder, left ear: Secondary | ICD-10-CM

## 2022-11-05 DIAGNOSIS — E1159 Type 2 diabetes mellitus with other circulatory complications: Secondary | ICD-10-CM

## 2022-11-05 DIAGNOSIS — R3911 Hesitancy of micturition: Secondary | ICD-10-CM | POA: Diagnosis not present

## 2022-11-05 LAB — COMPREHENSIVE METABOLIC PANEL
ALT: 27 U/L (ref 0–53)
AST: 25 U/L (ref 0–37)
Albumin: 4.1 g/dL (ref 3.5–5.2)
Alkaline Phosphatase: 61 U/L (ref 39–117)
BUN: 12 mg/dL (ref 6–23)
CO2: 29 mEq/L (ref 19–32)
Calcium: 9.3 mg/dL (ref 8.4–10.5)
Chloride: 104 mEq/L (ref 96–112)
Creatinine, Ser: 0.79 mg/dL (ref 0.40–1.50)
GFR: 92.54 mL/min (ref >60.00)
Glucose, Bld: 115 mg/dL — ABNORMAL HIGH (ref 70–99)
Potassium: 5.1 mEq/L (ref 3.5–5.1)
Sodium: 139 mEq/L (ref 135–145)
Total Bilirubin: 0.5 mg/dL (ref 0.2–1.2)
Total Protein: 7.2 g/dL (ref 6.0–8.3)

## 2022-11-05 LAB — CBC
HCT: 43.3 % (ref 39.0–52.0)
Hemoglobin: 14.4 g/dL (ref 13.0–17.0)
MCHC: 33.2 g/dL (ref 30.0–36.0)
MCV: 81.8 fl (ref 78.0–100.0)
Platelets: 231 10*3/uL (ref 150.0–400.0)
RBC: 5.3 Mil/uL (ref 4.22–5.81)
RDW: 13.8 % (ref 11.5–15.5)
WBC: 6.1 10*3/uL (ref 4.0–10.5)

## 2022-11-05 LAB — POCT GLYCOSYLATED HEMOGLOBIN (HGB A1C): Hemoglobin A1C: 7.3 % — AB (ref 4.0–5.6)

## 2022-11-05 MED ORDER — LEVOTHYROXINE SODIUM 50 MCG PO TABS: 50.0000 ug | ORAL_TABLET | Freq: Every morning | ORAL | 1 refills | Status: DC

## 2022-11-05 MED ORDER — GLIPIZIDE ER 10 MG PO TB24
10.0000 mg | ORAL_TABLET | Freq: Two times a day (BID) | ORAL | 1 refills | Status: DC
Start: 2022-11-05 — End: 2023-06-07

## 2022-11-05 MED ORDER — TAMSULOSIN HCL 0.4 MG PO CAPS
0.4000 mg | ORAL_CAPSULE | Freq: Every day | ORAL | 1 refills | Status: DC
Start: 2022-11-05 — End: 2023-07-29

## 2022-11-05 MED ORDER — ROSUVASTATIN CALCIUM 10 MG PO TABS: 10.0000 mg | ORAL_TABLET | Freq: Every day | ORAL | 3 refills | Status: DC

## 2022-11-05 MED ORDER — METOPROLOL SUCCINATE ER 25 MG PO TB24
25.0000 mg | ORAL_TABLET | Freq: Every day | ORAL | 1 refills | Status: DC
Start: 2022-11-05 — End: 2023-05-03

## 2022-11-05 MED ORDER — METFORMIN HCL 1000 MG PO TABS
1000.0000 mg | ORAL_TABLET | Freq: Two times a day (BID) | ORAL | 1 refills | Status: DC
Start: 2022-11-05 — End: 2023-05-03

## 2022-11-05 MED ORDER — FLUTICASONE PROPIONATE 50 MCG/ACT NA SUSP
2.0000 | Freq: Every day | NASAL | 3 refills | Status: DC
Start: 2022-11-05 — End: 2024-03-01

## 2022-11-05 NOTE — Assessment & Plan Note (Signed)
History of the same.  Patient is also experiencing some tinnitus.  Will refill Flonase nasal spray if no improvement patient will reach out and we will refer to ENT for formal audiological evaluation

## 2022-11-05 NOTE — Assessment & Plan Note (Signed)
Patient needed medication refill today refill provided.  Continue taking Xarelto and metoprolol

## 2022-11-05 NOTE — Assessment & Plan Note (Signed)
Patient currently maintained on levothyroxine 50 mcg daily.  Pending TSH continue medication as prescribed

## 2022-11-05 NOTE — Patient Instructions (Signed)
Nice to see you today I have refilled all your medications I have refilled the nose spray, if the ear do not improve let me know  Follow up with me in 3 months, sooner if you need me

## 2022-11-05 NOTE — Progress Notes (Signed)
Established Patient Office Visit  Subjective   Patient ID: Donald Chang, male    DOB: June 01, 1956  Age: 67 y.o. MRN: [id number]  Chief Complaint  Patient presents with   Diabetes   Tinnitus    Right side not all the time but sometimes.    HPI  DM2: patient is currently on glipizide and metformin. His A1c has trended down since last office visit Check sugars: states that he checks it every couple of weeks. 109 the last time he checked and then beofre 129 Hypo: none that he knows of Hyper: Exercise: none Diet : states that he is having 2 meals a day with some snacks (apples, bananas) Coffee 2-3 times a day. Water and sometime juice (grape or orange)  . Tinnitus: state that on the right side that is intermittent. States that the left one feels like something is blocking it. States that it has been going on for 2-3 months. States that he is having to turn the volume of the tv.      Review of Systems  Constitutional:  Negative for chills and fever.  HENT:  Positive for tinnitus. Negative for ear discharge and ear pain.   Respiratory:  Negative for shortness of breath.   Cardiovascular:  Negative for chest pain.  Neurological:  Negative for headaches.  Psychiatric/Behavioral:  Negative for hallucinations and suicidal ideas.       Objective:     BP 136/82   Pulse 76   Temp 98.1 F (36.7 C)   Resp 16   Ht 5\' 6"  (1.676 m)   Wt 179 lb 4 oz (81.3 kg)   SpO2 98%   BMI 28.93 kg/m  BP Readings from Last 3 Encounters:  11/05/22 136/82  07/02/22 128/82  04/28/22 132/82   Wt Readings from Last 3 Encounters:  11/05/22 179 lb 4 oz (81.3 kg)  07/02/22 179 lb (81.2 kg)  04/28/22 177 lb 8 oz (80.5 kg)      Physical Exam Vitals and nursing note reviewed.  Constitutional:      Appearance: Normal appearance.  Cardiovascular:     Rate and Rhythm: Normal rate and regular rhythm.     Pulses:          Dorsalis pedis pulses are 2+ on the right side and 2+ on the left  side.       Posterior tibial pulses are 2+ on the right side and 2+ on the left side.     Heart sounds: Normal heart sounds.  Pulmonary:     Effort: Pulmonary effort is normal.     Breath sounds: Normal breath sounds.  Musculoskeletal:     Right lower leg: No edema.     Left lower leg: No edema.  Feet:     Right foot:     Skin integrity: Skin integrity normal.     Left foot:     Skin integrity: Skin integrity normal.     Comments: Bilateral great toe nails missing  Neurological:     Mental Status: He is alert.      Results for orders placed or performed in visit on 11/05/22  POCT glycosylated hemoglobin (Hb A1C)  Result Value Ref Range   Hemoglobin A1C 7.3 (A) 4.0 - 5.6 %   HbA1c POC (<> result, manual entry)     HbA1c, POC (prediabetic range)     HbA1c, POC (controlled diabetic range)        The ASCVD Risk score (Arnett DK, et al., 2019)  failed to calculate for the following reasons:   The valid total cholesterol range is 130 to 320 mg/dL    Assessment & Plan:   Problem List Items Addressed This Visit       Cardiovascular and Mediastinum   Hypertension associated with diabetes (HCC) - Primary    Patient currently maintained on metoprolol.  Blood pressure within normal limits today continue taking medications as prescribed      Relevant Medications   glipiZIDE (GLUCOTROL XL) 10 MG 24 hr tablet   metFORMIN (GLUCOPHAGE) 1000 MG tablet   metoprolol succinate (TOPROL-XL) 25 MG 24 hr tablet   rosuvastatin (CRESTOR) 10 MG tablet   Other Relevant Orders   POCT glycosylated hemoglobin (Hb A1C) (Completed)   CBC   Comprehensive metabolic panel   Paroxysmal atrial fibrillation (HCC)    Patient needed medication refill today refill provided.  Continue taking Xarelto and metoprolol      Relevant Medications   metoprolol succinate (TOPROL-XL) 25 MG 24 hr tablet   rosuvastatin (CRESTOR) 10 MG tablet     Endocrine   Type 2 diabetes mellitus with obesity (HCC)     Patient currently maintained on glipizide 10 mg twice daily along with metformin 1000 mg twice daily.  A1c is trending down with lifestyle modifications continue medications as prescribed      Relevant Medications   glipiZIDE (GLUCOTROL XL) 10 MG 24 hr tablet   metFORMIN (GLUCOPHAGE) 1000 MG tablet   rosuvastatin (CRESTOR) 10 MG tablet   Other Relevant Orders   CBC   Comprehensive metabolic panel   Hypothyroidism    Patient currently maintained on levothyroxine 50 mcg daily.  Pending TSH continue medication as prescribed      Relevant Medications   levothyroxine (SYNTHROID) 50 MCG tablet   metoprolol succinate (TOPROL-XL) 25 MG 24 hr tablet     Nervous and Auditory   Eustachian tube dysfunction, bilateral   Relevant Medications   fluticasone (FLONASE) 50 MCG/ACT nasal spray   Eustachian tube dysfunction, left    History of the same.  Patient is also experiencing some tinnitus.  Will refill Flonase nasal spray if no improvement patient will reach out and we will refer to ENT for formal audiological evaluation        Other   Urinary hesitancy    Refill provided on tamsulosin 0.4 milligrams today      Relevant Medications   tamsulosin (FLOMAX) 0.4 MG CAPS capsule    Return in about 3 months (around 02/05/2023) for DM recheck.    Audria Nine, NP

## 2022-11-05 NOTE — Assessment & Plan Note (Signed)
Refill provided on tamsulosin 0.4 milligrams today

## 2022-11-05 NOTE — Assessment & Plan Note (Signed)
Patient currently maintained on glipizide 10 mg twice daily along with metformin 1000 mg twice daily.  A1c is trending down with lifestyle modifications continue medications as prescribed

## 2022-11-05 NOTE — Assessment & Plan Note (Signed)
Patient currently maintained on metoprolol.  Blood pressure within normal limits today continue taking medications as prescribed

## 2023-01-31 ENCOUNTER — Other Ambulatory Visit: Payer: Self-pay | Admitting: Nurse Practitioner

## 2023-01-31 DIAGNOSIS — H6993 Unspecified Eustachian tube disorder, bilateral: Secondary | ICD-10-CM

## 2023-02-11 ENCOUNTER — Telehealth: Payer: Self-pay

## 2023-02-11 NOTE — Telephone Encounter (Signed)
Received fax from [facility] facility in regards to a referral to Sinda Du, MD.  The note states that the pt canceled appointment on 12/29/22.   Ambulatory Referral to Ophthalmology.

## 2023-03-04 ENCOUNTER — Ambulatory Visit (INDEPENDENT_AMBULATORY_CARE_PROVIDER_SITE_OTHER): Payer: Medicare Other | Admitting: Nurse Practitioner

## 2023-03-04 ENCOUNTER — Encounter: Payer: Self-pay | Admitting: Nurse Practitioner

## 2023-03-04 VITALS — BP 120/82 | HR 91 | Temp 97.8°F | Ht 66.0 in | Wt 178.0 lb

## 2023-03-04 DIAGNOSIS — Z7984 Long term (current) use of oral hypoglycemic drugs: Secondary | ICD-10-CM | POA: Diagnosis not present

## 2023-03-04 DIAGNOSIS — E1159 Type 2 diabetes mellitus with other circulatory complications: Secondary | ICD-10-CM

## 2023-03-04 DIAGNOSIS — I152 Hypertension secondary to endocrine disorders: Secondary | ICD-10-CM | POA: Diagnosis not present

## 2023-03-04 DIAGNOSIS — E1169 Type 2 diabetes mellitus with other specified complication: Secondary | ICD-10-CM | POA: Diagnosis not present

## 2023-03-04 DIAGNOSIS — E669 Obesity, unspecified: Secondary | ICD-10-CM

## 2023-03-04 LAB — POCT GLYCOSYLATED HEMOGLOBIN (HGB A1C): Hemoglobin A1C: 7.7 % — AB (ref 4.0–5.6)

## 2023-03-04 NOTE — Progress Notes (Signed)
Established Patient Office Visit  Subjective   Patient ID: Donald Chang, male    DOB: 19-Mar-1956  Age: 67 y.o. MRN: [id number]  Chief Complaint  Patient presents with   Diabetes    Does not see anyone for diabetic eye exam      DM2: patient is currenlty glipizde 10mg  BID and metformin 1000mg  BID. Checks his sugars infrequently . States las t time he checkd it was 2 weeks ago and it was 99 fasting when he checked it last  States that he is not exercising   HTN: states that he does feel good.  Patient currently maintained on metoprolol 25 mg daily.  Patient did bring his medication with food and he has another prescription for metoprolol 50 mg.  From a different provider I did not comment did not take just to take the vitamins prescribed by me.   Review of Systems  Constitutional:  Negative for chills and fever.  Respiratory:  Negative for shortness of breath.   Cardiovascular:  Negative for chest pain.  Gastrointestinal:        Bm daily   Neurological:  Negative for tingling and headaches.      Objective:     BP 120/82   Pulse 91   Temp 97.8 F (36.6 C) (Temporal)   Ht 5\' 6"  (1.676 m)   Wt 178 lb (80.7 kg)   SpO2 99%   BMI 28.73 kg/m  BP Readings from Last 3 Encounters:  03/04/23 120/82  11/05/22 136/82  07/02/22 128/82   Wt Readings from Last 3 Encounters:  03/04/23 178 lb (80.7 kg)  11/05/22 179 lb 4 oz (81.3 kg)  07/02/22 179 lb (81.2 kg)      Physical Exam Vitals and nursing note reviewed.  Constitutional:      Appearance: Normal appearance.  Cardiovascular:     Rate and Rhythm: Normal rate and regular rhythm.     Pulses:          Dorsalis pedis pulses are 2+ on the right side and 2+ on the left side.       Posterior tibial pulses are 2+ on the right side and 2+ on the left side.     Heart sounds: Normal heart sounds.  Pulmonary:     Effort: Pulmonary effort is normal.     Breath sounds: Normal breath sounds.  Abdominal:     General:  Bowel sounds are normal.  Musculoskeletal:     Right lower leg: No edema.     Left lower leg: No edema.  Feet:     Right foot:     Skin integrity: Skin integrity normal.     Left foot:     Skin integrity: Skin integrity normal.  Neurological:     Mental Status: He is alert.      Results for orders placed or performed in visit on 03/04/23  POCT glycosylated hemoglobin (Hb A1C)  Result Value Ref Range   Hemoglobin A1C 7.7 (A) 4.0 - 5.6 %   HbA1c POC (<> result, manual entry)     HbA1c, POC (prediabetic range)     HbA1c, POC (controlled diabetic range)        The ASCVD Risk score (Arnett DK, et al., 2019) failed to calculate for the following reasons:   The valid total cholesterol range is 130 to 320 mg/dL    Assessment & Plan:   Problem List Items Addressed This Visit       Cardiovascular and Mediastinum  Hypertension associated with diabetes (HCC)    Patient currently maintained on metoprolol 25 mg once daily.  Continue this medication.  Patient tolerating it well and blood pressure under normal limits        Endocrine   Type 2 diabetes mellitus with obesity (HCC) - Primary    Patient currently maintained on metformin 1000 mg twice daily and glipizide 10 mg twice daily.  A1c elevated today.  Will not change medication at this juncture will refer to diabetic education to work on nutritional intake.  If A1c is higher or stayed the same consider using a GLP-1 receptor agonist at next office visit.      Relevant Orders   POCT glycosylated hemoglobin (Hb A1C) (Completed)   Referral to Nutrition and Diabetes Services    Return in about 3 months (around 06/03/2023) for CPE and Labs.    Audria Nine, NP

## 2023-03-04 NOTE — Assessment & Plan Note (Signed)
Patient currently maintained on metoprolol 25 mg once daily.  Continue this medication.  Patient tolerating it well and blood pressure under normal limits

## 2023-03-04 NOTE — Patient Instructions (Signed)
Nice to see you today We are not going to change any medications Follow up with me in 3 months. We will do your physical and labs at that point

## 2023-03-04 NOTE — Assessment & Plan Note (Signed)
Patient currently maintained on metformin 1000 mg twice daily and glipizide 10 mg twice daily.  A1c elevated today.  Will not change medication at this juncture will refer to diabetic education to work on nutritional intake.  If A1c is higher or stayed the same consider using a GLP-1 receptor agonist at next office visit.

## 2023-03-18 ENCOUNTER — Encounter: Payer: Self-pay | Admitting: Internal Medicine

## 2023-03-18 ENCOUNTER — Ambulatory Visit (INDEPENDENT_AMBULATORY_CARE_PROVIDER_SITE_OTHER): Payer: Medicare Other | Admitting: Internal Medicine

## 2023-03-18 VITALS — BP 132/82 | HR 95 | Temp 97.1°F | Ht 66.0 in | Wt 179.0 lb

## 2023-03-18 DIAGNOSIS — R21 Rash and other nonspecific skin eruption: Secondary | ICD-10-CM

## 2023-03-18 MED ORDER — TRIAMCINOLONE ACETONIDE 0.1 % EX CREA: 1.0000 | TOPICAL_CREAM | Freq: Two times a day (BID) | CUTANEOUS | 1 refills | Status: DC | PRN

## 2023-03-18 NOTE — Patient Instructions (Signed)
Please try the prescription cream for the itching areas. Also take cetirizine 10mg  at night--and if needed, in the morning also--to help the itching

## 2023-03-18 NOTE — Assessment & Plan Note (Signed)
Looks like some kind of bug bite No secondary infection but localized urticarial look Will recommend antihistamine and Rx for triamcinolone cream

## 2023-03-18 NOTE — Progress Notes (Signed)
Subjective:    Patient ID: Donald Chang, male    DOB: 04/26/1956, 67 y.o.   MRN: [id number]  HPI Here due to rash  Noticed on trunk--abdomen and back and some on legs Started a few days ago No recent illness No known exposures--just mows lawn  Some itching Did start with first one near left hip--then spread  Current Outpatient Medications on File Prior to Visit  Medication Sig Dispense Refill   fluticasone (FLONASE) 50 MCG/ACT nasal spray Place 2 sprays into both nostrils daily. 16 g 3   glipiZIDE (GLUCOTROL XL) 10 MG 24 hr tablet Take 1 tablet (10 mg total) by mouth 2 (two) times daily. 180 tablet 1   levothyroxine (SYNTHROID) 50 MCG tablet Take 1 tablet (50 mcg total) by mouth every morning. 90 tablet 1   metFORMIN (GLUCOPHAGE) 1000 MG tablet Take 1 tablet (1,000 mg total) by mouth 2 (two) times daily. 180 tablet 1   metoprolol succinate (TOPROL-XL) 25 MG 24 hr tablet Take 1 tablet (25 mg total) by mouth daily. 90 tablet 1   Omega-3 1000 MG CAPS Omega 3  1 capsule every day     rivaroxaban (XARELTO) 20 MG TABS tablet Take 1 tablet (20 mg total) by mouth daily. 30 tablet 5   rosuvastatin (CRESTOR) 10 MG tablet Take 1 tablet (10 mg total) by mouth daily. 90 tablet 3   tamsulosin (FLOMAX) 0.4 MG CAPS capsule Take 1 capsule (0.4 mg total) by mouth daily. 90 capsule 1   No current facility-administered medications on file prior to visit.    No Known Allergies  Past Medical History:  Diagnosis Date   Diabetes mellitus without complication (HCC)    Hyperlipidemia    Hypertension     Past Surgical History:  Procedure Laterality Date   NO PAST SURGERIES      Family History  Problem Relation Age of Onset   Healthy Mother    Hyperlipidemia Father    Hypertension Father    Diabetes Father     Social History   Socioeconomic History   Marital status: Married    Spouse name: filomena   Number of children: 4   Years of education: Not on file   Highest education  level: Not on file  Occupational History   Not on file  Tobacco Use   Smoking status: Former    Current packs/day: 0.00    Average packs/day: 0.3 packs/day for 10.0 years (2.5 ttl pk-yrs)    Types: Cigarettes    Start date: 54    Quit date: 97    Years since quitting: 31.7   Smokeless tobacco: Never  Vaping Use   Vaping status: Never Used  Substance and Sexual Activity   Alcohol use: Not Currently   Drug use: No   Sexual activity: Not on file  Other Topics Concern   Not on file  Social History Narrative   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    Social Determinants of Corporate investment banker Strain: Not on file  Food Insecurity: No Food Insecurity (02/20/2022)   Hunger Vital Sign    Worried About Running Out of Food in the Last Year: Never true    Ran Out of Food in the Last Year: Never true  Transportation Needs: No Transportation Needs (02/20/2022)   PRAPARE - Administrator, Civil Service (Medical): No    Lack of Transportation (Non-Medical): No  Physical Activity: Not on file  Stress: Not on file  Social Connections: Not on file  Intimate Partner Violence: Not on file   Review of Systems No fever Doesn't feel sick No recent travel Some trouble sleeping     Objective:   Physical Exam Constitutional:      Appearance: Normal appearance.  Skin:    Comments: Scattered pinpoint red papules on trunk and upper back---slight wheal and flare around some  Neurological:     Mental Status: He is alert.            Assessment & Plan:

## 2023-03-30 ENCOUNTER — Ambulatory Visit (INDEPENDENT_AMBULATORY_CARE_PROVIDER_SITE_OTHER): Payer: Medicare Other | Admitting: *Deleted

## 2023-03-30 DIAGNOSIS — Z0000 Encounter for general adult medical examination without abnormal findings: Secondary | ICD-10-CM

## 2023-03-30 NOTE — Progress Notes (Signed)
Subjective:   Donald Chang is a 67 y.o. male who presents for Medicare Annual/Subsequent preventive examination.  Visit Complete: Virtual  I connected with  [employer] on 03/30/23 by a audio enabled telemedicine application and verified that I am speaking with the correct person using two identifiers.  Patient Location: Home  Provider Location: Home Office  I discussed the limitations of evaluation and management by telemedicine. The patient expressed understanding and agreed to proceed.  Because this visit was a virtual/telehealth visit, some criteria may be missing or patient reported. Any vitals not documented were not able to be obtained and vitals that have been documented are patient reported.    Cardiac Risk Factors include: advanced age (>52men, >33 women);diabetes mellitus;male gender;dyslipidemia;hypertension     Objective:    There were no vitals filed for this visit. There is no height or weight on file to calculate BMI.     03/30/2023   10:46 AM 05/30/2014   11:07 AM 12/16/2012    2:50 PM  Advanced Directives  Does Patient Have a Medical Advance Directive? No No Patient does not have advance directive  Would patient like information on creating a medical advance directive? No - Patient declined No - patient declined information   Pre-existing out of facility DNR order (yellow form or pink MOST form)   No    Current Medications (verified) Outpatient Encounter Medications as of 03/30/2023  Medication Sig   fluticasone (FLONASE) 50 MCG/ACT nasal spray Place 2 sprays into both nostrils daily.   glipiZIDE (GLUCOTROL XL) 10 MG 24 hr tablet Take 1 tablet (10 mg total) by mouth 2 (two) times daily.   levothyroxine (SYNTHROID) 50 MCG tablet Take 1 tablet (50 mcg total) by mouth every morning.   metFORMIN (GLUCOPHAGE) 1000 MG tablet Take 1 tablet (1,000 mg total) by mouth 2 (two) times daily.   metoprolol succinate (TOPROL-XL) 25 MG 24 hr tablet Take 1 tablet (25 mg  total) by mouth daily.   Omega-3 1000 MG CAPS Omega 3  1 capsule every day   rivaroxaban (XARELTO) 20 MG TABS tablet Take 1 tablet (20 mg total) by mouth daily.   rosuvastatin (CRESTOR) 10 MG tablet Take 1 tablet (10 mg total) by mouth daily.   tamsulosin (FLOMAX) 0.4 MG CAPS capsule Take 1 capsule (0.4 mg total) by mouth daily.   triamcinolone cream (KENALOG) 0.1 % Apply 1 Application topically 2 (two) times daily as needed.   No facility-administered encounter medications on file as of 03/30/2023.    Allergies (verified) Patient has no known allergies.   History: Past Medical History:  Diagnosis Date   Diabetes mellitus without complication (HCC)    Hyperlipidemia    Hypertension    Past Surgical History:  Procedure Laterality Date   NO PAST SURGERIES     Family History  Problem Relation Age of Onset   Healthy Mother    Hyperlipidemia Father    Hypertension Father    Diabetes Father    Social History   Socioeconomic History   Marital status: Married    Spouse name: filomena   Number of children: 4   Years of education: Not on file   Highest education level: Not on file  Occupational History   Not on file  Tobacco Use   Smoking status: Former    Current packs/day: 0.00    Average packs/day: 0.3 packs/day for 10.0 years (2.5 ttl pk-yrs)    Types: Cigarettes    Start date: 80  Quit date: 71    Years since quitting: 31.7   Smokeless tobacco: Never  Vaping Use   Vaping status: Never Used  Substance and Sexual Activity   Alcohol use: Not Currently   Drug use: No   Sexual activity: Not Currently  Other Topics Concern   Not on file  Social History Narrative   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    Social Determinants of Corporate investment banker Strain: Not on file  Food Insecurity: No Food Insecurity (03/30/2023)   Hunger Vital Sign    Worried About Running Out of Food in the Last Year: Never true    Ran Out of Food in the Last Year: Never  true  Transportation Needs: No Transportation Needs (03/30/2023)   PRAPARE - Administrator, Civil Service (Medical): No    Lack of Transportation (Non-Medical): No  Physical Activity: Inactive (03/30/2023)   Exercise Vital Sign    Days of Exercise per Week: 0 days    Minutes of Exercise per Session: 0 min  Stress: No Stress Concern Present (03/30/2023)   [employer] of Occupational Health - Occupational Stress Questionnaire    Feeling of Stress : Not at all  Social Connections: Moderately Isolated (03/30/2023)   Social Connection and Isolation Panel [NHANES]    Frequency of Communication with Friends and Family: Three times a week    Frequency of Social Gatherings with Friends and Family: Once a week    Attends Religious Services: Never    Database administrator or Organizations: No    Attends Engineer, structural: Never    Marital Status: Married    Tobacco Counseling Counseling given: Not Answered   Clinical Intake:  Pre-visit preparation completed: Yes  Pain : No/denies pain     Diabetes: Yes CBG done?: No Did pt. bring in CBG monitor from home?: No  How often do you need to have someone help you when you read instructions, pamphlets, or other written materials from your doctor or pharmacy?: 1 - Never  Interpreter Needed?: No  Information entered by :: Remi Haggard LPN   Activities of Daily Living    03/30/2023   10:54 AM  In your present state of health, do you have any difficulty performing the following activities:  Hearing? 0  Vision? 0  Difficulty concentrating or making decisions? 0  Walking or climbing stairs? 0  Dressing or bathing? 0  Doing errands, shopping? 0  Preparing Food and eating ? N  Using the Toilet? N  In the past six months, have you accidently leaked urine? N  Do you have problems with loss of bowel control? N  Managing your Medications? N  Managing your Finances? N  Housekeeping or managing your Housekeeping?  N    Patient Care Team: Eden Emms, NP as PCP - General (Nurse Practitioner) Thurmon Fair, MD as PCP - Cardiology (Cardiology)  Indicate any recent Medical Services you may have received from other than [facility] providers in the past year (date may be approximate).     Assessment:   This is a routine wellness examination for Kyshon.  Hearing/Vision screen Hearing Screening - Comments:: No trouble hearing Vision Screening - Comments:: Up to date Unsure of name [place]   Goals Addressed             This Visit's Progress    Increase physical activity         Depression Screen    03/30/2023  10:46 AM 04/28/2022    9:25 AM  PHQ 2/9 Scores  PHQ - 2 Score 0 0  PHQ- 9 Score 0     Fall Risk    03/30/2023   10:40 AM 11/05/2022   10:10 AM 02/20/2022    2:52 PM  Fall Risk   Falls in the past year? 0 0 0  Number falls in past yr: 0 0 0  Injury with Fall? 0 0   Risk for fall due to :  No Fall Risks   Follow up Falls evaluation completed;Education provided;Falls prevention discussed Falls evaluation completed     MEDICARE RISK AT HOME: Medicare Risk at Home Any stairs in or around the home?: Yes If so, are there any without handrails?: No Home free of loose throw rugs in walkways, pet beds, electrical cords, etc?: Yes Adequate lighting in your home to reduce risk of falls?: Yes Life alert?: No Use of a cane, walker or w/c?: No Grab bars in the bathroom?: No Shower chair or bench in shower?: No Elevated toilet seat or a handicapped toilet?: No  TIMED UP AND GO:  Was the test performed?  No    Cognitive Function:        03/30/2023   10:42 AM  6CIT Screen  What Year? 0 points  What month? 0 points  What time? 0 points  Count back from 20 0 points  Months in reverse 4 points  Repeat phrase 8 points  Total Score 12 points    Immunizations Immunization History  Administered Date(s) Administered   Influenza, High Dose Seasonal PF 03/19/2022    PFIZER(Purple Top)SARS-COV-2 Vaccination 10/27/2019, 11/20/2019   Pfizer Covid-19 Vaccine Bivalent Booster 76yrs & up 07/18/2020, 05/05/2021    TDAP status: Due, Education has been provided regarding the importance of this vaccine. Advised may receive this vaccine at local pharmacy or Health Dept. Aware to provide a copy of the vaccination record if obtained from local pharmacy or Health Dept. Verbalized acceptance and understanding.  Flu Vaccine status: Due, Education has been provided regarding the importance of this vaccine. Advised may receive this vaccine at local pharmacy or Health Dept. Aware to provide a copy of the vaccination record if obtained from local pharmacy or Health Dept. Verbalized acceptance and understanding.  Pneumococcal vaccine status: Due, Education has been provided regarding the importance of this vaccine. Advised may receive this vaccine at local pharmacy or Health Dept. Aware to provide a copy of the vaccination record if obtained from local pharmacy or Health Dept. Verbalized acceptance and understanding.  Covid-19 vaccine status: Information provided on how to obtain vaccines.   Qualifies for Shingles Vaccine? Yes   Zostavax completed No   Shingrix Completed?: No.    Education has been provided regarding the importance of this vaccine. Patient has been advised to call insurance company to determine out of pocket expense if they have not yet received this vaccine. Advised may also receive vaccine at local pharmacy or Health Dept. Verbalized acceptance and understanding.  Screening Tests Health Maintenance  Topic Date Due   OPHTHALMOLOGY EXAM  Never done   Hepatitis C Screening  Never done   Diabetic kidney evaluation - Urine ACR  02/28/2023   COVID-19 Vaccine (5 - 2023-24 season) 04/15/2023 (Originally 02/28/2023)   Colonoscopy  04/30/2023 (Originally 04/16/2001)   INFLUENZA VACCINE  09/27/2023 (Originally 01/28/2023)   DTaP/Tdap/Td (1 - Tdap) 03/29/2024 (Originally  04/17/1975)   Pneumonia Vaccine 69+ Years old (1 of 1 - PCV) 03/29/2024 (Originally 04/16/2021)  Zoster Vaccines- Shingrix (1 of 2) 05/29/2024 (Originally 04/17/1975)   FOOT EXAM  04/29/2023   HEMOGLOBIN A1C  09/01/2023   Diabetic kidney evaluation - eGFR measurement  11/05/2023   Medicare Annual Wellness (AWV)  03/29/2024   HPV VACCINES  Aged Out    Health Maintenance  Health Maintenance Due  Topic Date Due   OPHTHALMOLOGY EXAM  Never done   Hepatitis C Screening  Never done   Diabetic kidney evaluation - Urine ACR  02/28/2023    Colonoscopy   Education provided  Lung Cancer Screening: (Low Dose CT Chest recommended if Age 4-80 years, 20 pack-year currently smoking OR have quit w/in 15years.) does not qualify.   Lung Cancer Screening Referral:   Additional Screening:  Hepatitis C Screening: does qualify;   Vision Screening: Recommended annual ophthalmology exams for early detection of glaucoma and other disorders of the eye. Is the patient up to date with their annual eye exam?  Yes  Who is the provider or what is the name of the office in which the patient attends annual eye exams? Unsure of name If pt is not established with a provider, would they like to be referred to a provider to establish care? No .   Dental Screening: Recommended annual dental exams for proper oral hygiene  Nutrition Risk Assessment:  Has the patient had any N/V/D within the last 2 months?  No  Does the patient have any non-healing wounds?  No  Has the patient had any unintentional weight loss or weight gain?  No   Diabetes:  Is the patient diabetic?  Yes  If diabetic, was a CBG obtained today?  No  Did the patient bring in their glucometer from home?  No  How often do you monitor your CBG's? 1 a week.   Financial Strains and Diabetes Management:  Are you having any financial strains with the device, your supplies or your medication? No .  Does the patient want to be seen by Chronic Care  Management for management of their diabetes?  No  Would the patient like to be referred to a Nutritionist or for Diabetic Management?  No   Diabetic Exams:  Diabetic Eye Exam: Completed .Marland Kitchen Pt has been advised about the importance in completing this exam.  Diabetic Foot Exam:. Pt has been advised about the importance in completing this exam. .    Community Resource Referral / Chronic Care Management: CRR required this visit?  No   CCM required this visit?  No     Plan:     I have personally reviewed and noted the following in the patient's chart:   Medical and social history Use of alcohol, tobacco or illicit drugs  Current medications and supplements including opioid prescriptions. Patient is not currently taking opioid prescriptions. Functional ability and status Nutritional status Physical activity Advanced directives List of other physicians Hospitalizations, surgeries, and ER visits in previous 12 months Vitals Screenings to include cognitive, depression, and falls Referrals and appointments  In addition, I have reviewed and discussed with patient certain preventive protocols, quality metrics, and best practice recommendations. A written personalized care plan for preventive services as well as general preventive health recommendations were provided to patient.     Remi Haggard, LPN   16/06/958   After Visit Summary: (MyChart) Due to this being a telephonic visit, the after visit summary with patients personalized plan was offered to patient via MyChart   Nurse Notes:

## 2023-03-30 NOTE — Patient Instructions (Signed)
[name] , Thank you for taking time to come for your Medicare Wellness Visit. I appreciate your ongoing commitment to your health goals. Please review the following plan we discussed and let me know if I can assist you in the future.   Screening recommendations/referrals: Colonoscopy: Education provided Recommended yearly ophthalmology/optometry visit for glaucoma screening and checkup Recommended yearly dental visit for hygiene and checkup  Vaccinations: Influenza vaccine: Education provided Pneumococcal vaccine: Education provided Tdap vaccine: Education provided Shingles vaccine: Education provided    Advanced directives: Education provided    Preventive Care 65 Years and Older, Male Preventive care refers to lifestyle choices and visits with your health care provider that can promote health and wellness. What does preventive care include? A yearly physical exam. This is also called an annual well check. Dental exams once or twice a year. Routine eye exams. Ask your health care provider how often you should have your eyes checked. Personal lifestyle choices, including: Daily care of your teeth and gums. Regular physical activity. Eating a healthy diet. Avoiding tobacco and drug use. Limiting alcohol use. Practicing safe sex. Taking low doses of aspirin every day. Taking vitamin and mineral supplements as recommended by your health care provider. What happens during an annual well check? The services and screenings done by your health care provider during your annual well check will depend on your age, overall health, lifestyle risk factors, and family history of disease. Counseling  Your health care provider may ask you questions about your: Alcohol use. Tobacco use. Drug use. Emotional well-being. Home and relationship well-being. Sexual activity. Eating habits. History of falls. Memory and ability to understand (cognition). Work and work Astronomer. Screening   You may have the following tests or measurements: Height, weight, and BMI. Blood pressure. Lipid and cholesterol levels. These may be checked every 5 years, or more frequently if you are over 84 years old. Skin check. Lung cancer screening. You may have this screening every year starting at age 50 if you have a 30-pack-year history of smoking and currently smoke or have quit within the past 15 years. Fecal occult blood test (FOBT) of the stool. You may have this test every year starting at age 40. Flexible sigmoidoscopy or colonoscopy. You may have a sigmoidoscopy every 5 years or a colonoscopy every 10 years starting at age 104. Prostate cancer screening. Recommendations will vary depending on your family history and other risks. Hepatitis C blood test. Hepatitis B blood test. Sexually transmitted disease (STD) testing. Diabetes screening. This is done by checking your blood sugar (glucose) after you have not eaten for a while (fasting). You may have this done every 1-3 years. Abdominal aortic aneurysm (AAA) screening. You may need this if you are a current or former smoker. Osteoporosis. You may be screened starting at age 2 if you are at high risk. Talk with your health care provider about your test results, treatment options, and if necessary, the need for more tests. Vaccines  Your health care provider may recommend certain vaccines, such as: Influenza vaccine. This is recommended every year. Tetanus, diphtheria, and acellular pertussis (Tdap, Td) vaccine. You may need a Td booster every 10 years. Zoster vaccine. You may need this after age 42. Pneumococcal 13-valent conjugate (PCV13) vaccine. One dose is recommended after age 18. Pneumococcal polysaccharide (PPSV23) vaccine. One dose is recommended after age 77. Talk to your health care provider about which screenings and vaccines you need and how often you need them. This information is not intended  to replace advice given to you by  your health care provider. Make sure you discuss any questions you have with your health care provider. Document Released: 07/12/2015 Document Revised: 03/04/2016 Document Reviewed: 04/16/2015 Elsevier Interactive Patient Education  2017 [employer].  Fall Prevention in the Home Falls can cause injuries. They can happen to people of all ages. There are many things you can do to make your home safe and to help prevent falls. What can I do on the outside of my home? Regularly fix the edges of walkways and driveways and fix any cracks. Remove anything that might make you trip as you walk through a door, such as a raised step or threshold. Trim any bushes or trees on the path to your home. Use bright outdoor lighting. Clear any walking paths of anything that might make someone trip, such as rocks or tools. Regularly check to see if handrails are loose or broken. Make sure that both sides of any steps have handrails. Any raised decks and porches should have guardrails on the edges. Have any leaves, snow, or ice cleared regularly. Use sand or salt on walking paths during winter. Clean up any spills in your garage right away. This includes oil or grease spills. What can I do in the bathroom? Use night lights. Install grab bars by the toilet and in the tub and shower. Do not use towel bars as grab bars. Use non-skid mats or decals in the tub or shower. If you need to sit down in the shower, use a plastic, non-slip stool. Keep the floor dry. Clean up any water that spills on the floor as soon as it happens. Remove soap buildup in the tub or shower regularly. Attach bath mats securely with double-sided non-slip rug tape. Do not have throw rugs and other things on the floor that can make you trip. What can I do in the bedroom? Use night lights. Make sure that you have a light by your bed that is easy to reach. Do not use any sheets or blankets that are too big for your bed. They should not hang  down onto the floor. Have a firm chair that has side arms. You can use this for support while you get dressed. Do not have throw rugs and other things on the floor that can make you trip. What can I do in the kitchen? Clean up any spills right away. Avoid walking on wet floors. Keep items that you use a lot in easy-to-reach places. If you need to reach something above you, use a strong step stool that has a grab bar. Keep electrical cords out of the way. Do not use floor polish or wax that makes floors slippery. If you must use wax, use non-skid floor wax. Do not have throw rugs and other things on the floor that can make you trip. What can I do with my stairs? Do not leave any items on the stairs. Make sure that there are handrails on both sides of the stairs and use them. Fix handrails that are broken or loose. Make sure that handrails are as long as the stairways. Check any carpeting to make sure that it is firmly attached to the stairs. Fix any carpet that is loose or worn. Avoid having throw rugs at the top or bottom of the stairs. If you do have throw rugs, attach them to the floor with carpet tape. Make sure that you have a light switch at the top of the stairs and  the bottom of the stairs. If you do not have them, ask someone to add them for you. What else can I do to help prevent falls? Wear shoes that: Do not have high heels. Have rubber bottoms. Are comfortable and fit you well. Are closed at the toe. Do not wear sandals. If you use a stepladder: Make sure that it is fully opened. Do not climb a closed stepladder. Make sure that both sides of the stepladder are locked into place. Ask someone to hold it for you, if possible. Clearly mark and make sure that you can see: Any grab bars or handrails. First and last steps. Where the edge of each step is. Use tools that help you move around (mobility aids) if they are needed. These  include: Canes. Walkers. Scooters. Crutches. Turn on the lights when you go into a dark area. Replace any light bulbs as soon as they burn out. Set up your furniture so you have a clear path. Avoid moving your furniture around. If any of your floors are uneven, fix them. If there are any pets around you, be aware of where they are. Review your medicines with your doctor. Some medicines can make you feel dizzy. This can increase your chance of falling. Ask your doctor what other things that you can do to help prevent falls. This information is not intended to replace advice given to you by your health care provider. Make sure you discuss any questions you have with your health care provider. Document Released: 04/11/2009 Document Revised: 11/21/2015 Document Reviewed: 07/20/2014 Elsevier Interactive Patient Education  2017 [employer].

## 2023-05-02 ENCOUNTER — Other Ambulatory Visit: Payer: Self-pay | Admitting: Nurse Practitioner

## 2023-05-02 DIAGNOSIS — E1169 Type 2 diabetes mellitus with other specified complication: Secondary | ICD-10-CM

## 2023-05-02 DIAGNOSIS — I480 Paroxysmal atrial fibrillation: Secondary | ICD-10-CM

## 2023-05-02 DIAGNOSIS — I152 Hypertension secondary to endocrine disorders: Secondary | ICD-10-CM

## 2023-06-05 ENCOUNTER — Other Ambulatory Visit: Payer: Self-pay | Admitting: Nurse Practitioner

## 2023-06-05 DIAGNOSIS — E1169 Type 2 diabetes mellitus with other specified complication: Secondary | ICD-10-CM

## 2023-06-15 ENCOUNTER — Telehealth: Payer: Self-pay | Admitting: Nurse Practitioner

## 2023-06-15 NOTE — Telephone Encounter (Signed)
Patient needs refil for Glipizide ER 10 mg Tablet  [facility] [address] [place], [place], [place] [address]

## 2023-06-15 NOTE — Telephone Encounter (Signed)
Medication was sent in on 06/07/2023 for 6 months

## 2023-06-15 NOTE — Telephone Encounter (Signed)
Pt made aware and stated that he has picked up prescription.

## 2023-07-29 ENCOUNTER — Other Ambulatory Visit: Payer: Self-pay | Admitting: Nurse Practitioner

## 2023-07-29 DIAGNOSIS — E039 Hypothyroidism, unspecified: Secondary | ICD-10-CM

## 2023-07-29 DIAGNOSIS — R3911 Hesitancy of micturition: Secondary | ICD-10-CM

## 2023-08-23 ENCOUNTER — Telehealth: Payer: Self-pay | Admitting: Nurse Practitioner

## 2023-08-23 NOTE — Telephone Encounter (Signed)
  LAST APPOINTMENT DATE: 03/18/23 Acute with [facility] 03/04/23 DM follow up told to follow up around 06/03/23 was not seen    NEXT APPOINTMENT DATE: 09/08/2023 has app scheduled for dm f/u    LAST REFILL: 10/19/22  QTY: #30 5 rf   Patient should have been out of meds in October. I have called to find out if he as been getting from another provider but no answer no voicemail.   Called pharmacy he has refill ready for pick up. Patient still has 3 refills left on the script that was given in September. He has not been getting filled on regular bases.

## 2023-08-23 NOTE — Telephone Encounter (Signed)
 Prescription Request  08/23/2023  LOV: 03/04/2023  What is the name of the medication or equipment? rivaroxaban (XARELTO) 20 MG TABS tablet   Have you contacted your pharmacy to request a refill? No   Which pharmacy would you like this sent to?  [id number] [place], [place] - [address] [place] [address] Phone: [phone] Fax: [phone]    Patient notified that their request is being sent to the clinical staff for review and that they should receive a response within 2 business days.   Please advise at Mobile [phone] (mobile)

## 2023-09-08 ENCOUNTER — Encounter: Payer: Self-pay | Admitting: Nurse Practitioner

## 2023-09-08 ENCOUNTER — Ambulatory Visit: Payer: Medicare Other | Admitting: Nurse Practitioner

## 2023-09-08 VITALS — BP 120/74 | HR 58 | Temp 98.0°F | Ht 63.75 in | Wt 177.4 lb

## 2023-09-08 DIAGNOSIS — Z0000 Encounter for general adult medical examination without abnormal findings: Secondary | ICD-10-CM

## 2023-09-08 DIAGNOSIS — E1159 Type 2 diabetes mellitus with other circulatory complications: Secondary | ICD-10-CM

## 2023-09-08 DIAGNOSIS — L600 Ingrowing nail: Secondary | ICD-10-CM | POA: Diagnosis not present

## 2023-09-08 DIAGNOSIS — Z7901 Long term (current) use of anticoagulants: Secondary | ICD-10-CM

## 2023-09-08 DIAGNOSIS — E1169 Type 2 diabetes mellitus with other specified complication: Secondary | ICD-10-CM

## 2023-09-08 DIAGNOSIS — Z6830 Body mass index (BMI) 30.0-30.9, adult: Secondary | ICD-10-CM

## 2023-09-08 DIAGNOSIS — Z87891 Personal history of nicotine dependence: Secondary | ICD-10-CM | POA: Diagnosis not present

## 2023-09-08 DIAGNOSIS — Z7984 Long term (current) use of oral hypoglycemic drugs: Secondary | ICD-10-CM

## 2023-09-08 DIAGNOSIS — E039 Hypothyroidism, unspecified: Secondary | ICD-10-CM

## 2023-09-08 DIAGNOSIS — R3911 Hesitancy of micturition: Secondary | ICD-10-CM

## 2023-09-08 DIAGNOSIS — E669 Obesity, unspecified: Secondary | ICD-10-CM

## 2023-09-08 DIAGNOSIS — E663 Overweight: Secondary | ICD-10-CM

## 2023-09-08 DIAGNOSIS — I152 Hypertension secondary to endocrine disorders: Secondary | ICD-10-CM | POA: Diagnosis not present

## 2023-09-08 DIAGNOSIS — I480 Paroxysmal atrial fibrillation: Secondary | ICD-10-CM

## 2023-09-08 DIAGNOSIS — Z125 Encounter for screening for malignant neoplasm of prostate: Secondary | ICD-10-CM | POA: Diagnosis not present

## 2023-09-08 DIAGNOSIS — N529 Male erectile dysfunction, unspecified: Secondary | ICD-10-CM

## 2023-09-08 LAB — LIPID PANEL
Cholesterol: 121 mg/dL (ref 0–200)
HDL: 54.9 mg/dL (ref >39.00)
LDL Cholesterol: 44 mg/dL (ref 0–99)
NonHDL: 65.88
Total CHOL/HDL Ratio: 2
Triglycerides: 108 mg/dL (ref 0.0–149.0)
VLDL: 21.6 mg/dL (ref 0.0–40.0)

## 2023-09-08 LAB — POCT GLYCOSYLATED HEMOGLOBIN (HGB A1C): Hemoglobin A1C: 7.7 % — AB (ref 4.0–5.6)

## 2023-09-08 LAB — COMPREHENSIVE METABOLIC PANEL
ALT: 30 U/L (ref 0–53)
AST: 24 U/L (ref 0–37)
Albumin: 4.5 g/dL (ref 3.5–5.2)
Alkaline Phosphatase: 73 U/L (ref 39–117)
BUN: 15 mg/dL (ref 6–23)
CO2: 28 meq/L (ref 19–32)
Calcium: 9.5 mg/dL (ref 8.4–10.5)
Chloride: 105 meq/L (ref 96–112)
Creatinine, Ser: 0.76 mg/dL (ref 0.40–1.50)
GFR: 93.08 mL/min (ref >60.00)
Glucose, Bld: 169 mg/dL — ABNORMAL HIGH (ref 70–99)
Potassium: 5 meq/L (ref 3.5–5.1)
Sodium: 140 meq/L (ref 135–145)
Total Bilirubin: 0.6 mg/dL (ref 0.2–1.2)
Total Protein: 7.3 g/dL (ref 6.0–8.3)

## 2023-09-08 LAB — CBC
HCT: 43.5 % (ref 39.0–52.0)
Hemoglobin: 14.1 g/dL (ref 13.0–17.0)
MCHC: 32.5 g/dL (ref 30.0–36.0)
MCV: 82.3 fl (ref 78.0–100.0)
Platelets: 232 10*3/uL (ref 150.0–400.0)
RBC: 5.29 Mil/uL (ref 4.22–5.81)
RDW: 14.5 % (ref 11.5–15.5)
WBC: 5.1 10*3/uL (ref 4.0–10.5)

## 2023-09-08 LAB — MICROALBUMIN / CREATININE URINE RATIO
Creatinine,U: 183.3 mg/dL
Microalb Creat Ratio: 26.9 mg/g (ref 0.0–30.0)
Microalb, Ur: 4.9 mg/dL — ABNORMAL HIGH (ref 0.0–1.9)

## 2023-09-08 LAB — PSA, MEDICARE: PSA: 0.41 ng/mL (ref 0.10–4.00)

## 2023-09-08 LAB — TSH: TSH: 3.9 u[IU]/mL (ref 0.35–5.50)

## 2023-09-08 MED ORDER — SILDENAFIL CITRATE 50 MG PO TABS: 25.0000 mg | ORAL_TABLET | Freq: Every day | ORAL | 0 refills | Status: AC | PRN

## 2023-09-08 MED ORDER — TAMSULOSIN HCL 0.4 MG PO CAPS: 0.4000 mg | ORAL_CAPSULE | Freq: Every day | ORAL | 1 refills | Status: DC

## 2023-09-08 MED ORDER — EMPAGLIFLOZIN 10 MG PO TABS: 10.0000 mg | ORAL_TABLET | Freq: Every day | ORAL | 1 refills | Status: DC

## 2023-09-08 NOTE — Patient Instructions (Signed)
Nice to see you today I will be in touch with the labs Follow up with me in 3 months sooner if you need me

## 2023-09-08 NOTE — Assessment & Plan Note (Signed)
 Pending urine microscopy to rule out microscopic hematuria

## 2023-09-08 NOTE — Assessment & Plan Note (Signed)
 Pending TSH and lipid panel.  Continue work on healthy lifestyle modifications inclusive of diet and exercise

## 2023-09-08 NOTE — Assessment & Plan Note (Signed)
 History of same.  Patient is on Xarelto 20 mg daily and metoprolol 25 mg daily.  Continue medication as prescribed.  Patient states Xarelto is expensive ambulatory referral to pharmacy services for medication assistance

## 2023-09-08 NOTE — Assessment & Plan Note (Signed)
 Patient currently maintained on metformin 1000 mg twice daily and glipizide 10 mg twice daily.  A1c above goal at 7.7%.  Will add on Jardiance 10 mg.  Continue medication as prescribed follow-up 3 months for A1c recheck

## 2023-09-08 NOTE — Assessment & Plan Note (Signed)
 Continue tamsulosin 0.4 mg nightly.  Refill provided today

## 2023-09-08 NOTE — Assessment & Plan Note (Signed)
 Discussed age-appropriate immunizations and screening exams.  Did review patient's personal, surgical, social, family histories.  Patient is up-to-date on all age-appropriate vaccinations he would like.  Patient has iFOB from insurance company that he is coming off today.  PSA ordered for prostate cancer screening today.  Patient was given information at discharge about preventative healthcare maintenance with anticipatory guidance.

## 2023-09-08 NOTE — Assessment & Plan Note (Signed)
 Patient currently maintained on metoprolol 25 mg daily.  Blood pressure well-controlled in office.  Continue taking medication as prescribed

## 2023-09-08 NOTE — Assessment & Plan Note (Signed)
 Patient currently maintained on levothyroxine 50 mcg daily.  Pending TSH today.  Continue medication as prescribed

## 2023-09-08 NOTE — Assessment & Plan Note (Signed)
 Patient can take sildenafil 25-50 mg daily as needed sexual intercourse.

## 2023-09-08 NOTE — Assessment & Plan Note (Signed)
 Ingrown toenail that is not infected.  Ambulatory referral to podiatry

## 2023-09-08 NOTE — Progress Notes (Signed)
 Established Patient Office Visit  Subjective   Patient ID: Donald Chang, male    DOB: 12-Sep-1955  Age: 68 y.o. MRN: [id number]  Chief Complaint  Patient presents with   Annual Exam   Medication Problem    Pt complains of flomax being too expensive.     HPI   Afib: he is currently maintained on metoprolol 25 daily and xarletto 20mg  daily   Dm2: patient is currently maintained on glipizide 10mg  BID and metformin 1000mg  BID. He is also on Crestor 10mg  daily. Has not been checking sugar at home because he ran out of strips.    Hypothyroidism: levothyroxine  HLD: crestor 10mg  and fish oils   for complete physical and follow up of chronic conditions.  Immunizations: -Tetanus: Completed in  -Influenza: up to date  -Shingles: get at local pharmacy  -Pneumonia:  thinks he got it in 2021  Diet: Fair diet. 2 meals a day and not many snakcs. He will drink coffee and diet Dr. Reino Kent  Exercise: No regular exercise. States that he helps his son. When the weather is better he will take a walk  Eye exam: needs updating  Dental exam: Completes semi-annually    Colonoscopy: Completed in does ifob through his insurace  Lung Cancer Screening: Completed in   PSA: Due  Sleep: goes to bed around 830-9 and he will get up around 6-7. Feels like he sleeps ok He did see a doctor in [place] and was given melatonin  Does snore       Review of Systems  Constitutional:  Negative for chills and fever.  Respiratory:  Negative for shortness of breath.   Cardiovascular:  Negative for chest pain and leg swelling.  Gastrointestinal:  Negative for abdominal pain, blood in stool, constipation, diarrhea, nausea and vomiting.       Bm dailyu  Genitourinary:  Negative for dysuria and hematuria.       Nocturia intermittently x2  Neurological:  Negative for tingling and headaches.  Psychiatric/Behavioral:  Negative for hallucinations and suicidal ideas.       Objective:     BP 120/74    Pulse (!) 58   Temp 98 F (36.7 C) (Oral)   Ht 5' 3.75" (1.619 m)   Wt 177 lb 6.4 oz (80.5 kg)   SpO2 98%   BMI 30.69 kg/m  BP Readings from Last 3 Encounters:  09/08/23 120/74  03/18/23 132/82  03/04/23 120/82   Wt Readings from Last 3 Encounters:  09/08/23 177 lb 6.4 oz (80.5 kg)  03/18/23 179 lb (81.2 kg)  03/04/23 178 lb (80.7 kg)   SpO2 Readings from Last 3 Encounters:  09/08/23 98%  03/18/23 100%  03/04/23 99%      Physical Exam Vitals and nursing note reviewed.  Constitutional:      Appearance: Normal appearance.  HENT:     Right Ear: Tympanic membrane, ear canal and external ear normal.     Left Ear: Tympanic membrane, ear canal and external ear normal.     Mouth/Throat:     Mouth: Mucous membranes are moist.     Pharynx: Oropharynx is clear.  Eyes:     Extraocular Movements: Extraocular movements intact.     Pupils: Pupils are equal, round, and reactive to light.  Cardiovascular:     Rate and Rhythm: Normal rate and regular rhythm.     Pulses: Normal pulses.     Heart sounds: Normal heart sounds.  Pulmonary:     Effort:  Pulmonary effort is normal.     Breath sounds: Normal breath sounds.  Abdominal:     General: Bowel sounds are normal. There is no distension.     Palpations: There is no mass.     Tenderness: There is no abdominal tenderness.     Hernia: No hernia is present.  Musculoskeletal:     Right lower leg: No edema.     Left lower leg: No edema.  Lymphadenopathy:     Cervical: No cervical adenopathy.  Skin:    General: Skin is warm.  Neurological:     General: No focal deficit present.     Mental Status: He is alert.     Deep Tendon Reflexes:     Reflex Scores:      Bicep reflexes are 2+ on the right side and 2+ on the left side.      Patellar reflexes are 2+ on the right side and 2+ on the left side.    Comments: Bilateral upper and lower extremity strength 5/5  Psychiatric:        Mood and Affect: Mood normal.        Behavior:  Behavior normal.        Thought Content: Thought content normal.        Judgment: Judgment normal.    Title   Diabetic Foot Exam - detailed Date & Time: 09/08/2023  9:22 AM Diabetic Foot exam was performed with the following findings: Yes  Is there a history of foot ulcer?: No Is there a foot ulcer now?: No Is there swelling?: No Is there elevated skin temperature?: No Is there abnormal foot shape?: No Is there a claw toe deformity?: No Are the toenails long?: No Are the toenails thick?: No Are the toenails ingrown?: Yes Pulse Foot Exam completed.: Yes   Right Posterior Tibialis: Present Left posterior Tibialis: Present   Right Dorsalis Pedis: Present Left Dorsalis Pedis: Present     Sensory Foot Exam Completed.: Yes Semmes-Weinstein Monofilament Test "+" means "has sensation" and "-" means "no sensation"      Image components are not supported.   Image components are not supported. Image components are not supported.  Tuning Fork Comments All 10 sites tested sensation intact bilaterally.      Results for orders placed or performed in visit on 09/08/23  POCT glycosylated hemoglobin (Hb A1C)  Result Value Ref Range   Hemoglobin A1C 7.7 (A) 4.0 - 5.6 %   HbA1c POC (<> result, manual entry)     HbA1c, POC (prediabetic range)     HbA1c, POC (controlled diabetic range)        The ASCVD Risk score (Arnett DK, et al., 2019) failed to calculate for the following reasons:   The valid total cholesterol range is 130 to 320 mg/dL    Assessment & Plan:   Problem List Items Addressed This Visit       Cardiovascular and Mediastinum   Hypertension associated with diabetes (HCC)   Patient currently maintained on metoprolol 25 mg daily.  Blood pressure well-controlled in office.  Continue taking medication as prescribed      Relevant Medications   sildenafil (VIAGRA) 50 MG tablet   empagliflozin (JARDIANCE) 10 MG TABS tablet   Other Relevant Orders   CBC    Comprehensive metabolic panel   Paroxysmal atrial fibrillation (HCC)   History of same.  Patient is on Xarelto 20 mg daily and metoprolol 25 mg daily.  Continue medication as prescribed.  Patient  states Xarelto is expensive ambulatory referral to pharmacy services for medication assistance      Relevant Medications   sildenafil (VIAGRA) 50 MG tablet   Other Relevant Orders   AMB Referral [facility] Care Management     Endocrine   Type 2 diabetes mellitus with obesity (HCC)   Patient currently maintained on metformin 1000 mg twice daily and glipizide 10 mg twice daily.  A1c above goal at 7.7%.  Will add on Jardiance 10 mg.  Continue medication as prescribed follow-up 3 months for A1c recheck      Relevant Medications   empagliflozin (JARDIANCE) 10 MG TABS tablet   Other Relevant Orders   Microalbumin / creatinine urine ratio   CBC   Comprehensive metabolic panel   Lipid panel   POCT glycosylated hemoglobin (Hb A1C) (Completed)   Ambulatory referral to Ophthalmology   Hypothyroidism   Patient currently maintained on levothyroxine 50 mcg daily.  Pending TSH today.  Continue medication as prescribed      Relevant Orders   TSH     Musculoskeletal and Integument   Ingrown toenail   Ingrown toenail that is not infected.  Ambulatory referral to podiatry      Relevant Orders   Ambulatory referral to Podiatry     Other   Current use of long term anticoagulation   Overweight   Pending TSH and lipid panel.  Continue work on healthy lifestyle modifications inclusive of diet and exercise      Urinary hesitancy   Continue tamsulosin 0.4 mg nightly.  Refill provided today      Relevant Medications   tamsulosin (FLOMAX) 0.4 MG CAPS capsule   Preventative health care - Primary   Discussed age-appropriate immunizations and screening exams.  Did review patient's personal, surgical, social, family histories.  Patient is up-to-date on all age-appropriate vaccinations he would like.  Patient  has iFOB from insurance company that he is coming off today.  PSA ordered for prostate cancer screening today.  Patient was given information at discharge about preventative healthcare maintenance with anticipatory guidance.      Former tobacco use   Pending urine microscopy to rule out microscopic hematuria.      Relevant Orders   Urine Microscopic   Erectile dysfunction   Patient can take sildenafil 25-50 mg daily as needed sexual intercourse.      Relevant Medications   sildenafil (VIAGRA) 50 MG tablet   Other Visit Diagnoses       Screening for prostate cancer       Relevant Orders   PSA, Medicare       Return in about 3 months (around 12/09/2023) for DM recheck.    Audria Nine, NP

## 2023-09-09 ENCOUNTER — Telehealth: Payer: Self-pay | Admitting: *Deleted

## 2023-09-09 LAB — URINALYSIS, MICROSCOPIC ONLY: RBC / HPF: NONE SEEN (ref 0–?)

## 2023-09-09 NOTE — Progress Notes (Signed)
 Care Guide Pharmacy Note  09/09/2023 Name: Donald Chang MRN: [id number] DOB: Jun 12, 1956  Referred By: Eden Emms, NP Reason for referral: Care Coordination (Initial outreach to schedule referral with PharmD)   Donald Chang is a 68 y.o. year old male who is a primary care patient of Eden Emms, NP.  Donald Chang was referred to the pharmacist for assistance related to: Atrial Fibrillation  Successful contact was made with the patient to discuss pharmacy services including being ready for the pharmacist to call at least 5 minutes before the scheduled appointment time and to have medication bottles and any blood pressure readings ready for review. The patient agreed to meet with the pharmacist via telephone visit on (date/time).3/19 at 10:00 AM   [facility]  [facility] Guide  Direct Dial: [phone]  Fax [phone]

## 2023-09-15 ENCOUNTER — Other Ambulatory Visit: Admitting: Pharmacist

## 2023-09-15 DIAGNOSIS — I480 Paroxysmal atrial fibrillation: Secondary | ICD-10-CM

## 2023-09-15 NOTE — Progress Notes (Signed)
   09/15/2023 Name: Donald Chang MRN: [id number] DOB: 1956-06-01  Subjective  Chief Complaint  Patient presents with   Medication Access   Care Team: Primary Care Provider: Eden Emms, NP  Reason for visit: ?  Donald Chang is a 68 y.o. male who presents today for a telephone visit with the pharmacist due to medication access concerns regarding their Xarelto. ?   Medication Access: ?  Reports that all medications are not affordable.  States that his copay increased this year. Was previously paying ~$140/70mo which was reasonable for him At beginning of the year, he was charged ~$300 for 1 month supply  Prescription drug coverage: Yes.  [employer] Medicare Advantage Plan 3 (HMO-POS) Xarelto: Tier 2 ($47/month after deductible) Formulary: JewelryFunds.co.uk.pdf Summary of Benefits: http://gray-george.biz/ There is a $340 deductible for drugs in Tier 3, 4 and 5 THEN brand will be $47/month or $141/97mo  Current Patient Assistance:  None  Assessment and Plan:   1. Medication Access Xarelto cost increased at the pharmacy 2/2 prescription deductible.  After $340 deductible is reached, cost will return to last year's cost ($141/90ds). Confirmed with pharmacy, next refill is $47/month.  Xarelto refill needed - patient prefers 90 day supply - request pended to PCP  No future appointments.  Loree Fee, PharmD Clinical Pharmacist [facility] [phone]

## 2023-09-23 ENCOUNTER — Ambulatory Visit: Admitting: Podiatry

## 2023-09-23 DIAGNOSIS — L600 Ingrowing nail: Secondary | ICD-10-CM | POA: Diagnosis not present

## 2023-09-23 MED ORDER — RIVAROXABAN 20 MG PO TABS: 20.0000 mg | ORAL_TABLET | Freq: Every day | ORAL | 3 refills | Status: AC

## 2023-09-23 NOTE — Progress Notes (Signed)
 Subjective:  Patient ID: Donald Chang, male    DOB: 06/01/1956,  MRN: [id number]  Chief Complaint  Patient presents with   Nail Problem    Pt stated that his left hallux nail is causing him some pain    68 y.o. male presents with the above complaint. Patient presents with left hallux medial border ingrown painful to touch is progressive and worse worse with ambulation worse with pressure pain scale 7 out of 10 dull achy in nature has not seen MRIs prior to seeing me would like to have it removed.  He had a procedure done in [place] which has helped some but not really he would like to have it removed   Review of Systems: Negative except as noted in the HPI. Denies N/V/F/Ch.  Past Medical History:  Diagnosis Date   Diabetes mellitus without complication (HCC)    Hyperlipidemia    Hypertension     Current Outpatient Medications:    empagliflozin (JARDIANCE) 10 MG TABS tablet, Take 1 tablet (10 mg total) by mouth daily before breakfast., Disp: 90 tablet, Rfl: 1   fluticasone (FLONASE) 50 MCG/ACT nasal spray, Place 2 sprays into both nostrils daily., Disp: 16 g, Rfl: 3   glipiZIDE (GLUCOTROL XL) 10 MG 24 hr tablet, TAKE 1 TABLET BY MOUTH TWICE A DAY, Disp: 180 tablet, Rfl: 1   levothyroxine (SYNTHROID) 50 MCG tablet, TAKE 1 TABLET BY MOUTH EVERY DAY IN THE MORNING, Disp: 90 tablet, Rfl: 1   metFORMIN (GLUCOPHAGE) 1000 MG tablet, TAKE 1 TABLET BY MOUTH TWICE A DAY, Disp: 180 tablet, Rfl: 1   metoprolol succinate (TOPROL-XL) 25 MG 24 hr tablet, TAKE 1 TABLET (25 MG TOTAL) BY MOUTH DAILY., Disp: 90 tablet, Rfl: 1   Omega-3 1000 MG CAPS, Omega 3  1 capsule every day, Disp: , Rfl:    rivaroxaban (XARELTO) 20 MG TABS tablet, Take 1 tablet (20 mg total) by mouth daily., Disp: 90 tablet, Rfl: 3   rosuvastatin (CRESTOR) 10 MG tablet, Take 1 tablet (10 mg total) by mouth daily., Disp: 90 tablet, Rfl: 3   sildenafil (VIAGRA) 50 MG tablet, Take 0.5-1 tablets (25-50 mg total) by mouth daily as  needed for erectile dysfunction., Disp: 10 tablet, Rfl: 0   tamsulosin (FLOMAX) 0.4 MG CAPS capsule, Take 1 capsule (0.4 mg total) by mouth daily., Disp: 90 capsule, Rfl: 1   triamcinolone cream (KENALOG) 0.1 %, Apply 1 Application topically 2 (two) times daily as needed. (Patient not taking: Reported on 09/08/2023), Disp: 45 g, Rfl: 1  Social History   Tobacco Use  Smoking Status Former   Current packs/day: 0.00   Average packs/day: 0.3 packs/day for 10.0 years (2.5 ttl pk-yrs)   Types: Cigarettes   Start date: 52   Quit date: 1993   Years since quitting: 32.2  Smokeless Tobacco Never    No Known Allergies Objective:  There were no vitals filed for this visit. There is no height or weight on file to calculate BMI. Constitutional Well developed. Well nourished.  Vascular Dorsalis pedis pulses palpable bilaterally. Posterior tibial pulses palpable bilaterally. Capillary refill normal to all digits.  No cyanosis or clubbing noted. Pedal hair growth normal.  Neurologic Normal speech. Oriented to person, place, and time. Epicritic sensation to light touch grossly present bilaterally.  Dermatologic Painful ingrowing nail at medial nail borders of the hallux nail left. No other open wounds. No skin lesions.  Orthopedic: Normal joint ROM without pain or crepitus bilaterally. No visible deformities. No bony tenderness.  Radiographs: None Assessment:   1. Ingrown left big toenail    Plan:  Patient was evaluated and treated and all questions answered.  Ingrown Nail, left -Patient elects to proceed with minor surgery to remove ingrown toenail removal today. Consent reviewed and signed by patient. -Ingrown nail excised. See procedure note. -Educated on post-procedure care including soaking. Written instructions provided and reviewed. -Patient to follow up in 2 weeks for nail check.  Procedure: Excision of Ingrown Toenail Location: Left 1st toe medial nail  borders. Anesthesia: Lidocaine 1% plain; 1.5 mL and Marcaine 0.5% plain; 1.5 mL, digital block. Skin Prep: Betadine. Dressing: Silvadene; telfa; dry, sterile, compression dressing. Technique: Following skin prep, the toe was exsanguinated and a tourniquet was secured at the base of the toe. The affected nail border was freed, split with a nail splitter, and excised. Chemical matrixectomy was then performed with phenol and irrigated out with alcohol. The tourniquet was then removed and sterile dressing applied. Disposition: Patient tolerated procedure well. Patient to return in 2 weeks for follow-up.   No follow-ups on file.

## 2023-09-30 ENCOUNTER — Encounter: Payer: Self-pay | Admitting: Nurse Practitioner

## 2023-10-04 ENCOUNTER — Telehealth: Payer: Self-pay | Admitting: Nurse Practitioner

## 2023-10-04 NOTE — Telephone Encounter (Signed)
 Patient dropped off document  medical clearance request , to be filled out by provider. Patient requested to send it back via Call Patient to pick up within 5-days. Document is located in providers tray at front office.Please advise at Mobile [phone] (mobile)

## 2023-10-07 NOTE — Telephone Encounter (Signed)
 Contacted pt and informed that the medical clearance form has been completed and will be up front for pick up.

## 2023-10-07 NOTE — Telephone Encounter (Signed)
 Form completed and in outgoing MA box

## 2023-12-07 ENCOUNTER — Other Ambulatory Visit: Payer: Self-pay | Admitting: Nurse Practitioner

## 2023-12-07 DIAGNOSIS — E669 Obesity, unspecified: Secondary | ICD-10-CM

## 2023-12-07 NOTE — Telephone Encounter (Signed)
 Needs DM follow up with me in the next 30 days

## 2023-12-08 NOTE — Telephone Encounter (Signed)
Called pt - voice mailbox has not been set up.

## 2023-12-09 NOTE — Telephone Encounter (Signed)
Called pt - voice mailbox has not been set up.

## 2023-12-13 NOTE — Telephone Encounter (Signed)
 Lvm with Son listed on Chart to call and schedule

## 2023-12-14 NOTE — Telephone Encounter (Signed)
 error

## 2023-12-15 NOTE — Telephone Encounter (Signed)
 3rd attempt

## 2023-12-22 ENCOUNTER — Other Ambulatory Visit: Payer: Self-pay | Admitting: Nurse Practitioner

## 2023-12-22 DIAGNOSIS — E1169 Type 2 diabetes mellitus with other specified complication: Secondary | ICD-10-CM

## 2023-12-29 ENCOUNTER — Ambulatory Visit (INDEPENDENT_AMBULATORY_CARE_PROVIDER_SITE_OTHER): Admitting: Nurse Practitioner

## 2023-12-29 ENCOUNTER — Telehealth: Payer: Self-pay | Admitting: Nurse Practitioner

## 2023-12-29 VITALS — BP 128/78 | HR 67 | Temp 97.8°F | Ht 63.75 in | Wt 177.0 lb

## 2023-12-29 DIAGNOSIS — Z7984 Long term (current) use of oral hypoglycemic drugs: Secondary | ICD-10-CM

## 2023-12-29 DIAGNOSIS — E1169 Type 2 diabetes mellitus with other specified complication: Secondary | ICD-10-CM

## 2023-12-29 DIAGNOSIS — E669 Obesity, unspecified: Secondary | ICD-10-CM | POA: Diagnosis not present

## 2023-12-29 DIAGNOSIS — M25812 Other specified joint disorders, left shoulder: Secondary | ICD-10-CM

## 2023-12-29 LAB — POCT GLYCOSYLATED HEMOGLOBIN (HGB A1C): Hemoglobin A1C: 8.5 % — AB (ref 4.0–5.6)

## 2023-12-29 MED ORDER — OZEMPIC (0.25 OR 0.5 MG/DOSE) 2 MG/3ML ~~LOC~~ SOPN
0.2500 mg | PEN_INJECTOR | SUBCUTANEOUS | 0 refills | Status: DC
Start: 2023-12-29 — End: 2024-01-24

## 2023-12-29 NOTE — Telephone Encounter (Signed)
 Ok by me if ok by Adina Crandall.

## 2023-12-29 NOTE — Assessment & Plan Note (Signed)
 Continue over-the-counter use of Voltaren gel as needed participate in rehab exercises at home at discharge.

## 2023-12-29 NOTE — Patient Instructions (Addendum)
 Me alegra verte hoy. Vamos a empezar con Ozempic, que se inyecta una vez a la semana. Cuando recibas [employer], llama y dalphine query visita con la enfermera para que podamos hacerlo juntos. Haz un seguimiento conmigo en 3 meses, o antes si me necesitas.

## 2023-12-29 NOTE — Progress Notes (Signed)
 Established Patient Office Visit  Subjective   Patient ID: Donald Chang, male    DOB: 03-20-1956  Age: 68 y.o. MRN: [id number]  Chief Complaint  Patient presents with   Diabetes   Medication Problem    Jardiance     HPI   DM2: Patient currently maintained on Jardiance  10 mg, glipizide  10 mg twice daily, metformin  1000 mg twice daily. Patient's last A1c was 7.7% we did add Jardiance  onto the last office visit. States that he was able to get the jardiance . States that's he has been off the medication for approx several weeks He tried the medication a few times and he started feeling sick  and stopped taking the medication. States that he felt drunk or dizzy. He has been checking it one time  States that approx 3am in the morning he gets hungry and will have a tortilla and milk  HTN: Patient currently maintained on metoprolol  25 mg daily.  Of note patient does have atrial fibrillation and is currently anticoagulated with rivaroxaban  20 mg daily   Shoulder pain: states that he has been having this for approx 2 months. State over the past week he had a pin point pain. State that he did use voltaren gel that did help. The pain is intermittent and not effected with movememnt. Can hurt with continued repeated use. Is described as a pain.    Review of Systems  Constitutional:  Negative for chills and fever.  Respiratory:  Negative for shortness of breath.   Cardiovascular:  Negative for chest pain.  Musculoskeletal:  Positive for joint pain.  Neurological:  Negative for headaches.      Objective:     BP 128/78   Pulse 67   Temp 97.8 F (36.6 C) (Oral)   Ht 5' 3.75 (1.619 m)   Wt 177 lb (80.3 kg)   SpO2 98%   BMI 30.62 kg/m  BP Readings from Last 3 Encounters:  12/29/23 128/78  09/08/23 120/74  03/18/23 132/82   Wt Readings from Last 3 Encounters:  12/29/23 177 lb (80.3 kg)  09/08/23 177 lb 6.4 oz (80.5 kg)  03/18/23 179 lb (81.2 kg)   SpO2 Readings from Last 3  Encounters:  12/29/23 98%  09/08/23 98%  03/18/23 100%      Physical Exam Vitals and nursing note reviewed.  Constitutional:      Appearance: Normal appearance.  Cardiovascular:     Rate and Rhythm: Normal rate and regular rhythm.     Heart sounds: Normal heart sounds.  Pulmonary:     Effort: Pulmonary effort is normal.     Breath sounds: Normal breath sounds.  Musculoskeletal:        General: Tenderness present.     Comments: Bilateral upper extremity strength 5/5 Bicep tendon 2+ bilaterally Radial pulse 2 + bilaterally  - Empty can test + Hawkins-Kennedy test  Neurological:     Mental Status: He is alert.      Results for orders placed or performed in visit on 12/29/23  POCT glycosylated hemoglobin (Hb A1C)  Result Value Ref Range   Hemoglobin A1C 8.5 (A) 4.0 - 5.6 %   HbA1c POC (<> result, manual entry)     HbA1c, POC (prediabetic range)     HbA1c, POC (controlled diabetic range)        The ASCVD Risk score (Arnett DK, et al., 2019) failed to calculate for the following reasons:   The valid total cholesterol range is 130 to 320 mg/dL  Assessment & Plan:   Problem List Items Addressed This Visit       Endocrine   Type 2 diabetes mellitus with obesity (HCC) - Primary   Patient currently maintained on metformin  1000 mg twice daily, glipizide  10 mg twice daily.  Had side effect of Jardiance  so this was discontinued. start patient on Ozempic 0.25 mg once weekly for 4 weeks anticipate to titrate up to 0.5 mg.      Relevant Medications   Semaglutide,0.25 or 0.5MG /DOS, (OZEMPIC, 0.25 OR 0.5 MG/DOSE,) 2 MG/3ML SOPN   Other Relevant Orders   POCT glycosylated hemoglobin (Hb A1C) (Completed)     Other   Impingement of left shoulder   Continue over-the-counter use of Voltaren gel as needed participate in rehab exercises at home at discharge.      Other Visit Diagnoses       Obesity (BMI 30-39.9)       Relevant Medications   Semaglutide,0.25 or  0.5MG /DOS, (OZEMPIC, 0.25 OR 0.5 MG/DOSE,) 2 MG/3ML SOPN       Return in about 3 months (around 03/30/2024) for DM recheck.    Adina Crandall, NP

## 2023-12-29 NOTE — Assessment & Plan Note (Signed)
 Patient currently maintained on metformin  1000 mg twice daily, glipizide  10 mg twice daily.  Had side effect of Jardiance  so this was discontinued. start patient on Ozempic 0.25 mg once weekly for 4 weeks anticipate to titrate up to 0.5 mg.

## 2023-12-29 NOTE — Telephone Encounter (Signed)
 Pt would like to know if he can transfer from [place] to Dr KANDICE. 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 No issues with Matt. Please advise.

## 2023-12-30 NOTE — Telephone Encounter (Signed)
 Also by me. He will need a 3 month appt for his DM follow up as he just saw me yesterday

## 2024-01-23 ENCOUNTER — Other Ambulatory Visit: Payer: Self-pay | Admitting: Nurse Practitioner

## 2024-01-23 DIAGNOSIS — E669 Obesity, unspecified: Secondary | ICD-10-CM

## 2024-02-02 ENCOUNTER — Ambulatory Visit: Admitting: Podiatry

## 2024-02-02 DIAGNOSIS — L600 Ingrowing nail: Secondary | ICD-10-CM | POA: Diagnosis not present

## 2024-02-02 NOTE — Progress Notes (Signed)
 Subjective:  Patient ID: Donald Chang, male    DOB: 07-Sep-1955,  MRN: [id number]  No chief complaint on file.   68 y.o. male presents with the above complaint.  Patient presents with right hallux medial border ingrown painful to touch is progressive and worse worse with ambulation or shoe pressure would like to have removed pain scale 7 out of 10 dull aching nature.  The left side is doing well.  Denies any other acute issues.   Review of Systems: Negative except as noted in the HPI. Denies N/V/F/Ch.  Past Medical History:  Diagnosis Date   Diabetes mellitus without complication (HCC)    Hyperlipidemia    Hypertension     Current Outpatient Medications:    fluticasone  (FLONASE ) 50 MCG/ACT nasal spray, Place 2 sprays into both nostrils daily., Disp: 16 g, Rfl: 3   glipiZIDE  (GLUCOTROL  XL) 10 MG 24 hr tablet, TAKE 1 TABLET BY MOUTH TWICE A DAY, Disp: 180 tablet, Rfl: 0   levothyroxine  (SYNTHROID ) 50 MCG tablet, TAKE 1 TABLET BY MOUTH EVERY DAY IN THE MORNING, Disp: 90 tablet, Rfl: 1   metFORMIN  (GLUCOPHAGE ) 1000 MG tablet, TAKE 1 TABLET BY MOUTH TWICE A DAY, Disp: 180 tablet, Rfl: 1   metoprolol  succinate (TOPROL -XL) 25 MG 24 hr tablet, TAKE 1 TABLET (25 MG TOTAL) BY MOUTH DAILY., Disp: 90 tablet, Rfl: 1   Omega-3 1000 MG CAPS, Omega 3  1 capsule every day, Disp: , Rfl:    rivaroxaban  (XARELTO ) 20 MG TABS tablet, Take 1 tablet (20 mg total) by mouth daily., Disp: 90 tablet, Rfl: 3   rosuvastatin  (CRESTOR ) 10 MG tablet, TAKE 1 TABLET BY MOUTH EVERY DAY, Disp: 90 tablet, Rfl: 3   Semaglutide ,0.25 or 0.5MG /DOS, (OZEMPIC , 0.25 OR 0.5 MG/DOSE,) 2 MG/3ML SOPN, Inject 0.5 mg into the skin once a week., Disp: 2 mL, Rfl: 1   sildenafil  (VIAGRA ) 50 MG tablet, Take 0.5-1 tablets (25-50 mg total) by mouth daily as needed for erectile dysfunction., Disp: 10 tablet, Rfl: 0   tamsulosin  (FLOMAX ) 0.4 MG CAPS capsule, Take 1 capsule (0.4 mg total) by mouth daily., Disp: 90 capsule, Rfl: 1    triamcinolone  cream (KENALOG ) 0.1 %, Apply 1 Application topically 2 (two) times daily as needed., Disp: 45 g, Rfl: 1  Social History   Tobacco Use  Smoking Status Former   Current packs/day: 0.00   Average packs/day: 0.3 packs/day for 10.0 years (2.5 ttl pk-yrs)   Types: Cigarettes   Start date: 33   Quit date: 40   Years since quitting: 32.6  Smokeless Tobacco Never    No Known Allergies Objective:  There were no vitals filed for this visit. There is no height or weight on file to calculate BMI. Constitutional Well developed. Well nourished.  Vascular Dorsalis pedis pulses palpable bilaterally. Posterior tibial pulses palpable bilaterally. Capillary refill normal to all digits.  No cyanosis or clubbing noted. Pedal hair growth normal.  Neurologic Normal speech. Oriented to person, place, and time. Epicritic sensation to light touch grossly present bilaterally.  Dermatologic Painful ingrowing nail at medial nail borders of the hallux nail right. No other open wounds. No skin lesions.  Orthopedic: Normal joint ROM without pain or crepitus bilaterally. No visible deformities. No bony tenderness.   Radiographs: None Assessment:  No diagnosis found. Plan:  Patient was evaluated and treated and all questions answered.  Ingrown Nail, right -Patient elects to proceed with minor surgery to remove ingrown toenail removal today. Consent reviewed and signed by patient. -Ingrown  nail excised. See procedure note. -Educated on post-procedure care including soaking. Written instructions provided and reviewed. -Patient to follow up in 2 weeks for nail check.  Procedure: Excision of Ingrown Toenail Location: Right 1st toe medial nail borders. Anesthesia: Lidocaine 1% plain; 1.5 mL and Marcaine 0.5% plain; 1.5 mL, digital block. Skin Prep: Betadine. Dressing: Silvadene; telfa; dry, sterile, compression dressing. Technique: Following skin prep, the toe was exsanguinated and a  tourniquet was secured at the base of the toe. The affected nail border was freed, split with a nail splitter, and excised. Chemical matrixectomy was then performed with phenol and irrigated out with alcohol. The tourniquet was then removed and sterile dressing applied. Disposition: Patient tolerated procedure well. Patient to return in 2 weeks for follow-up.   No follow-ups on file.

## 2024-02-05 ENCOUNTER — Other Ambulatory Visit: Payer: Self-pay | Admitting: Nurse Practitioner

## 2024-02-05 DIAGNOSIS — E039 Hypothyroidism, unspecified: Secondary | ICD-10-CM

## 2024-03-01 ENCOUNTER — Encounter: Payer: Self-pay | Admitting: Family Medicine

## 2024-03-01 ENCOUNTER — Ambulatory Visit (INDEPENDENT_AMBULATORY_CARE_PROVIDER_SITE_OTHER): Admitting: Family Medicine

## 2024-03-01 VITALS — BP 122/88 | HR 89 | Temp 99.0°F | Ht 63.75 in | Wt 175.1 lb

## 2024-03-01 DIAGNOSIS — Z7984 Long term (current) use of oral hypoglycemic drugs: Secondary | ICD-10-CM

## 2024-03-01 DIAGNOSIS — H8110 Benign paroxysmal vertigo, unspecified ear: Secondary | ICD-10-CM | POA: Insufficient documentation

## 2024-03-01 DIAGNOSIS — E1169 Type 2 diabetes mellitus with other specified complication: Secondary | ICD-10-CM

## 2024-03-01 DIAGNOSIS — R42 Dizziness and giddiness: Secondary | ICD-10-CM

## 2024-03-01 DIAGNOSIS — I4811 Longstanding persistent atrial fibrillation: Secondary | ICD-10-CM | POA: Diagnosis not present

## 2024-03-01 DIAGNOSIS — E66811 Obesity, class 1: Secondary | ICD-10-CM

## 2024-03-01 NOTE — Patient Instructions (Signed)
 VISIT SUMMARY: During your visit, we discussed your recent episodes of dizziness and ringing in the ears, as well as your ongoing management of type 2 diabetes, atrial fibrillation, hypertension, hyperlipidemia, and hypothyroidism. We reviewed your medications and made some adjustments to help manage your symptoms and conditions more effectively.  YOUR PLAN: -BENIGN PAROXYSMAL POSITIONAL VERTIGO: This condition is caused by an imbalance in the inner ear, leading to dizziness with certain head movements. We discussed exercises to help re-equilibrate your inner ear and advised you to avoid rapid head movements.  -TYPE 2 DIABETES MELLITUS: Your diabetes is well-controlled with your current medications, but we need to monitor your blood glucose levels closely, especially after stopping Ozempic . If your glucose levels rise, we may consider resuming Ozempic  at a lower frequency. Continue taking metformin  and glipizide , but skip the evening dose of glipizide  if your glucose is low.  -ATRIAL FIBRILLATION: This is a heart condition that causes an irregular and often rapid heart rate. Continue taking Xarelto  to reduce the risk of blood clots.  -ESSENTIAL HYPERTENSION: This is high blood pressure, which is important to control to reduce the risk of stroke and other complications. Continue taking metoprolol  to manage your blood pressure.  -HYPERLIPIDEMIA: This is high cholesterol, which is managed to reduce your cardiovascular risk. Continue your current lipid-lowering therapy.  -HYPOTHYROIDISM: This is an underactive thyroid , which is managed with levothyroxine . Continue taking your levothyroxine  daily.  INSTRUCTIONS: Please monitor your blood glucose levels closely and report any significant changes. Follow the exercises provided for your vertigo and avoid rapid head movements. Continue taking your medications as prescribed. If you experience any new or worsening symptoms, please contact our office. Schedule  a follow-up appointment in 4 weeks to review your progress.

## 2024-03-01 NOTE — Progress Notes (Unsigned)
 Ph: [phone] Fax: [phone]   Patient ID: Donald Chang, male    DOB: 1956-01-18, 68 y.o.   MRN: [id number]  This visit was conducted in person.  BP 122/88   Pulse 89   Temp 99 F (37.2 C) (Oral)   Ht 5' 3.75 (1.619 m)   Wt 175 lb 2 oz (79.4 kg)   SpO2 98%   BMI 30.30 kg/m   BP Readings from Last 3 Encounters:  03/01/24 122/88  12/29/23 128/78  09/08/23 120/74    Chief Complaint  Patient presents with   Tinnitus   Dizziness    Subjective:   Patient is in process of transferring care to me due to spanish-speaking status.   Discussed the use of AI scribe software for clinical note transcription with the patient, who gave verbal consent to proceed.  History of Present Illness   Donald Chang is a 68 year old male with type 2 diabetes and atrial fibrillation who presents with ringing in the ears and recent episodes of dizziness.  He experiences ringing in the ears, which began after a severe dizziness episode two days ago. The dizziness almost caused a fall at night and recurred the next morning, requiring wall support. Another episode occurred while turning his head quickly, lasting about five minutes. There is no associated nausea or vomiting, but the ringing persists.  He manages type 2 diabetes with metformin  and glipizide , having recently stopped Ozempic  due to side effects. Blood sugar levels are well-controlled, though he ran out of test strips. He has atrial fibrillation, managed with Xarelto  and metoprolol , and denies chest pain or shortness of breath during dizziness. A previous dizziness episode occurred two years ago after a long walk.  He recalls a past ear ringing episode that resolved after ear cleaning. He denies recent hearing changes but sometimes needs louder speech.  He takes levothyroxine  for thyroid  management, missing one dose recently. No recent headaches, eye pain, or visual disturbances.          Relevant past medical, surgical,  family and social history reviewed and updated as indicated. Interim medical history since our last visit reviewed. Allergies and medications reviewed and updated. Outpatient Medications Prior to Visit  Medication Sig Dispense Refill   glipiZIDE  (GLUCOTROL  XL) 10 MG 24 hr tablet TAKE 1 TABLET BY MOUTH TWICE A DAY 180 tablet 0   levothyroxine  (SYNTHROID ) 50 MCG tablet TAKE 1 TABLET BY MOUTH EVERY DAY IN THE MORNING 90 tablet 1   metFORMIN  (GLUCOPHAGE ) 1000 MG tablet TAKE 1 TABLET BY MOUTH TWICE A DAY 180 tablet 1   metoprolol  succinate (TOPROL -XL) 25 MG 24 hr tablet TAKE 1 TABLET (25 MG TOTAL) BY MOUTH DAILY. 90 tablet 1   rivaroxaban  (XARELTO ) 20 MG TABS tablet Take 1 tablet (20 mg total) by mouth daily. 90 tablet 3   rosuvastatin  (CRESTOR ) 10 MG tablet TAKE 1 TABLET BY MOUTH EVERY DAY 90 tablet 3   sildenafil  (VIAGRA ) 50 MG tablet Take 0.5-1 tablets (25-50 mg total) by mouth daily as needed for erectile dysfunction. 10 tablet 0   tamsulosin  (FLOMAX ) 0.4 MG CAPS capsule Take 1 capsule (0.4 mg total) by mouth daily. 90 capsule 1   triamcinolone  cream (KENALOG ) 0.1 % Apply 1 Application topically 2 (two) times daily as needed. 45 g 1   fluticasone  (FLONASE ) 50 MCG/ACT nasal spray Place 2 sprays into both nostrils daily. (Patient not taking: Reported on 03/01/2024) 16 g 3   Omega-3 1000 MG CAPS Omega 3  1  capsule every day (Patient not taking: Reported on 03/01/2024)     Semaglutide ,0.25 or 0.5MG /DOS, (OZEMPIC , 0.25 OR 0.5 MG/DOSE,) 2 MG/3ML SOPN Inject 0.5 mg into the skin once a week. 2 mL 1   No facility-administered medications prior to visit.     Per HPI unless specifically indicated in ROS section below Review of Systems  Objective:  BP 122/88   Pulse 89   Temp 99 F (37.2 C) (Oral)   Ht 5' 3.75 (1.619 m)   Wt 175 lb 2 oz (79.4 kg)   SpO2 98%   BMI 30.30 kg/m   Wt Readings from Last 3 Encounters:  03/01/24 175 lb 2 oz (79.4 kg)  12/29/23 177 lb (80.3 kg)  09/08/23 177 lb 6.4 oz  (80.5 kg)     Physical Exam Vitals and nursing note reviewed.  Constitutional:      Appearance: Normal appearance. He is not ill-appearing.  HENT:     Head: Normocephalic.     Right Ear: Hearing, tympanic membrane, ear canal and external ear normal. There is no impacted cerumen.     Left Ear: Hearing, tympanic membrane, ear canal and external ear normal. There is no impacted cerumen.     Mouth/Throat:     Mouth: Mucous membranes are moist.     Pharynx: Oropharynx is clear. No oropharyngeal exudate or posterior oropharyngeal erythema.  Eyes:     Extraocular Movements: Extraocular movements intact.     Pupils: Pupils are equal, round, and reactive to light.  Neck:     Thyroid : No thyroid  mass or thyromegaly.     Vascular: No carotid bruit.  Cardiovascular:     Rate and Rhythm: Normal rate. Rhythm irregular.     Pulses: Normal pulses.     Heart sounds: Normal heart sounds. No murmur heard. Pulmonary:     Effort: Pulmonary effort is normal. No respiratory distress.     Breath sounds: Normal breath sounds. No wheezing, rhonchi or rales.  Musculoskeletal:     Cervical back: Normal range of motion and neck supple. No rigidity.     Right lower leg: No edema.     Left lower leg: No edema.  Lymphadenopathy:     Cervical: No cervical adenopathy.  Skin:    General: Skin is warm and dry.     Findings: No rash.  Neurological:     General: No focal deficit present.     Mental Status: He is alert.     Cranial Nerves: Cranial nerves 2-12 are intact.     Sensory: Sensation is intact.     Motor: Motor function is intact.     Coordination: Coordination is intact. Romberg sign negative.     Gait: Gait is intact.     Comments:  CN 2-12 intact FTN intact EOMI  Negative romberg   Psychiatric:        Mood and Affect: Mood normal.        Behavior: Behavior normal.       Results   Recent sugars at home: Blood Glucose: 197 Blood Glucose: 145 Blood Glucose: 85 Blood Glucose: 80 Blood  Glucose: 70      Results for orders placed or performed in visit on 12/29/23  POCT glycosylated hemoglobin (Hb A1C)   Collection Time: 12/29/23 11:50 AM  Result Value Ref Range   Hemoglobin A1C 8.5 (A) 4.0 - 5.6 %   HbA1c POC (<> result, manual entry)     HbA1c, POC (prediabetic range)  HbA1c, POC (controlled diabetic range)     Lab Results  Component Value Date   CHOL 121 09/08/2023   HDL 54.90 09/08/2023   LDLCALC 44 09/08/2023   TRIG 108.0 09/08/2023   CHOLHDL 2 09/08/2023    Lab Results  Component Value Date   TSH 3.90 09/08/2023    Lab Results  Component Value Date   NA 140 09/08/2023   CL 105 09/08/2023   K 5.0 09/08/2023   CO2 28 09/08/2023   BUN 15 09/08/2023   CREATININE 0.76 09/08/2023   GFR 93.08 09/08/2023   CALCIUM  9.5 09/08/2023   ALBUMIN 4.5 09/08/2023   GLUCOSE 169 (H) 09/08/2023    Assessment & Plan:      Benign paroxysmal positional vertigo Episodes consistent with benign paroxysmal positional vertigo, likely due to inner ear imbalance. Differential diagnosis includes cerebrovascular events, but unlikely due to normal neurological exam and brief episode duration. Also less likely Meniere's disease as hearing seems unaffected.  - Provided information on benign paroxysmal positional vertigo. - Provided with handout on Epley repositioning exercises to re-equilibrate the inner ear. - Advised to avoid rapid head movements. - Update if new or worsening symptoms  Type 2 diabetes mellitus Diabetes well-controlled with recent fasting glucose levels 70-85 mg/dL. Ozempic  discontinued due to continued nausea and appetite loss, although this likely contributed to weight loss and improved glucose control. Risk of glucose increase post-Ozempic  cessation. - Monitor blood glucose levels closely. - Consider resuming Ozempic  if glucose levels rise, adjusting frequency to every 10 days vs staying at 0.25mg  dose to manage side effects. - Continue metformin  and  glipizide , withholding evening glipizide  dose if glucose is low. - Reassess control at f/u in 1 month.   Atrial fibrillation Atrial fibrillation managed with Xarelto  to reduce thromboembolic risk and beta blocker for rate control. - Continue Xarelto  for anticoagulation and Toprol  XL.  Essential hypertension Hypertension managed with metoprolol . Blood pressure control crucial to reduce cerebrovascular event risk, especially with atrial fibrillation. - Continue metoprolol  for blood pressure management.  Hyperlipidemia Hyperlipidemia management is part of cardiovascular risk reduction strategy. - Continue current lipid-lowering therapy.  Hypothyroidism Hypothyroidism managed with levothyroxine . Missed a dose recently but generally adheres to regimen. - Continue levothyroxine  daily.      Problem List Items Addressed This Visit     Longstanding persistent atrial fibrillation (HCC)   Chronic, stable period on xarelto  and toprol  XL.  Last saw cardiology 2022. Given overall stability, we can continue monitoring.       Type 2 diabetes mellitus with other specified complication (HCC)   Obesity, Class I, BMI 30-34.9   Vertigo - Primary   Story/exam most consistent with BPPV although negative Dix-Hallpike bilaterally in office. He does have stroke risk factors including diabetes, HTN, HLD, afib. No missed xarelto  doses.  See above.       Other Visit Diagnoses       Long term current use of oral hypoglycemic drug            No orders of the defined types were placed in this encounter.   No orders of the defined types were placed in this encounter.   Patient Instructions  VISIT SUMMARY: During your visit, we discussed your recent episodes of dizziness and ringing in the ears, as well as your ongoing management of type 2 diabetes, atrial fibrillation, hypertension, hyperlipidemia, and hypothyroidism. We reviewed your medications and made some adjustments to help manage your  symptoms and conditions more effectively.  YOUR PLAN: -  BENIGN PAROXYSMAL POSITIONAL VERTIGO: This condition is caused by an imbalance in the inner ear, leading to dizziness with certain head movements. We discussed exercises to help re-equilibrate your inner ear and advised you to avoid rapid head movements.  -TYPE 2 DIABETES MELLITUS: Your diabetes is well-controlled with your current medications, but we need to monitor your blood glucose levels closely, especially after stopping Ozempic . If your glucose levels rise, we may consider resuming Ozempic  at a lower frequency. Continue taking metformin  and glipizide , but skip the evening dose of glipizide  if your glucose is low.  -ATRIAL FIBRILLATION: This is a heart condition that causes an irregular and often rapid heart rate. Continue taking Xarelto  to reduce the risk of blood clots.  -ESSENTIAL HYPERTENSION: This is high blood pressure, which is important to control to reduce the risk of stroke and other complications. Continue taking metoprolol  to manage your blood pressure.  -HYPERLIPIDEMIA: This is high cholesterol, which is managed to reduce your cardiovascular risk. Continue your current lipid-lowering therapy.  -HYPOTHYROIDISM: This is an underactive thyroid , which is managed with levothyroxine . Continue taking your levothyroxine  daily.  INSTRUCTIONS: Please monitor your blood glucose levels closely and report any significant changes. Follow the exercises provided for your vertigo and avoid rapid head movements. Continue taking your medications as prescribed. If you experience any new or worsening symptoms, please contact our office. Schedule a follow-up appointment in 4 weeks to review your progress.  Follow up plan: No follow-ups on file.  Anton Blas, MD

## 2024-03-01 NOTE — Assessment & Plan Note (Signed)
 Story/exam most consistent with BPPV. He does have stroke risk factors including diabetes, HTN, HLD, afib.  No missed xarelto  doses.

## 2024-03-02 ENCOUNTER — Encounter: Payer: Self-pay | Admitting: Family Medicine

## 2024-03-02 DIAGNOSIS — Z7189 Other specified counseling: Secondary | ICD-10-CM | POA: Insufficient documentation

## 2024-03-02 NOTE — Assessment & Plan Note (Signed)
 Chronic, stable period on xarelto  and toprol  XL.  Last saw cardiology 2022. Given overall stability, we can continue monitoring.

## 2024-03-09 ENCOUNTER — Other Ambulatory Visit: Payer: Self-pay | Admitting: Nurse Practitioner

## 2024-03-09 DIAGNOSIS — E1169 Type 2 diabetes mellitus with other specified complication: Secondary | ICD-10-CM

## 2024-03-17 LAB — HM DIABETES EYE EXAM

## 2024-03-19 ENCOUNTER — Other Ambulatory Visit: Payer: Self-pay | Admitting: Nurse Practitioner

## 2024-03-19 DIAGNOSIS — E1159 Type 2 diabetes mellitus with other circulatory complications: Secondary | ICD-10-CM

## 2024-03-19 DIAGNOSIS — I480 Paroxysmal atrial fibrillation: Secondary | ICD-10-CM

## 2024-03-20 ENCOUNTER — Telehealth: Payer: Self-pay

## 2024-03-20 DIAGNOSIS — H35362 Drusen (degenerative) of macula, left eye: Secondary | ICD-10-CM | POA: Insufficient documentation

## 2024-03-20 DIAGNOSIS — E1169 Type 2 diabetes mellitus with other specified complication: Secondary | ICD-10-CM

## 2024-03-20 NOTE — Telephone Encounter (Signed)
 Copied from CRM [id number]. 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  [phone]

## 2024-03-20 NOTE — Telephone Encounter (Signed)
 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  Otherwise when is pt seeing eye doctor in [place]? He should let eye doctor know this was found

## 2024-03-20 NOTE — Assessment & Plan Note (Signed)
 Noted on house calls NP diabetic retinopathy screen - pt has eye doctor in [place].

## 2024-03-24 NOTE — Telephone Encounter (Signed)
 Patient called states that he was told this at last visit with eye doctor was over 3 yrs ago in [place]. He has not been seen after that time. Ok with referral to eye doctor would like to be seen in [place].

## 2024-03-24 NOTE — Telephone Encounter (Signed)
 Not able to leave message. Voice mail not set up

## 2024-03-27 NOTE — Telephone Encounter (Signed)
 New referral placed to [place]

## 2024-03-27 NOTE — Addendum Note (Signed)
 Addended by: RILLA BALLER on: 03/27/2024 06:55 AM   Modules accepted: Orders

## 2024-03-31 ENCOUNTER — Ambulatory Visit: Admitting: Nurse Practitioner

## 2024-04-04 ENCOUNTER — Encounter: Payer: Self-pay | Admitting: Family Medicine

## 2024-04-04 ENCOUNTER — Ambulatory Visit: Admitting: Family Medicine

## 2024-04-04 VITALS — BP 118/78 | HR 101 | Temp 97.9°F | Ht 63.75 in | Wt 172.4 lb

## 2024-04-04 DIAGNOSIS — I4811 Longstanding persistent atrial fibrillation: Secondary | ICD-10-CM | POA: Diagnosis not present

## 2024-04-04 DIAGNOSIS — D6869 Other thrombophilia: Secondary | ICD-10-CM | POA: Diagnosis not present

## 2024-04-04 DIAGNOSIS — E1169 Type 2 diabetes mellitus with other specified complication: Secondary | ICD-10-CM

## 2024-04-04 DIAGNOSIS — H35362 Drusen (degenerative) of macula, left eye: Secondary | ICD-10-CM

## 2024-04-04 DIAGNOSIS — R3911 Hesitancy of micturition: Secondary | ICD-10-CM

## 2024-04-04 DIAGNOSIS — Z7984 Long term (current) use of oral hypoglycemic drugs: Secondary | ICD-10-CM

## 2024-04-04 DIAGNOSIS — Z23 Encounter for immunization: Secondary | ICD-10-CM

## 2024-04-04 DIAGNOSIS — Z7985 Long-term (current) use of injectable non-insulin antidiabetic drugs: Secondary | ICD-10-CM | POA: Diagnosis not present

## 2024-04-04 DIAGNOSIS — I10 Essential (primary) hypertension: Secondary | ICD-10-CM

## 2024-04-04 LAB — POCT GLYCOSYLATED HEMOGLOBIN (HGB A1C): Hemoglobin A1C: 7.5 % — AB (ref 4.0–5.6)

## 2024-04-04 MED ORDER — BLOOD GLUCOSE MONITORING SUPPL DEVI: 1.0000 | Freq: Three times a day (TID) | 0 refills | Status: AC

## 2024-04-04 MED ORDER — TAMSULOSIN HCL 0.4 MG PO CAPS: 0.4000 mg | ORAL_CAPSULE | Freq: Every day | ORAL | 1 refills | Status: AC

## 2024-04-04 MED ORDER — LANCETS MISC. MISC
1.0000 | Freq: Three times a day (TID) | 0 refills | Status: AC
Start: 2024-04-04 — End: 2024-05-04

## 2024-04-04 MED ORDER — BLOOD GLUCOSE TEST VI STRP
1.0000 | ORAL_STRIP | Freq: Three times a day (TID) | 0 refills | Status: AC
Start: 2024-04-04 — End: 2024-05-04

## 2024-04-04 MED ORDER — LANCET DEVICE MISC: 1.0000 | Freq: Three times a day (TID) | 0 refills | Status: AC

## 2024-04-04 NOTE — Progress Notes (Signed)
 Ph: [phone] Fax: [phone]   Patient ID: Donald Chang, male    DOB: [birth year], 68 y.o.   MRN: [id number]  This visit was conducted in person.  BP 118/78   Pulse (!) 101   Temp 97.9 F (36.6 C) (Oral)   Ht 5' 3.75 (1.619 m)   Wt 172 lb 6 oz (78.2 kg)   SpO2 97%   BMI 29.82 kg/m    CC: transfer of care  Subjective:   HPI: Donald Chang is a 68 y.o. male presenting on 04/04/2024 for Transitions Of Care (3 mo DM f/u/Pt states the dizziness is better./)   See prior note for details.  Vertigo symptoms have resolved.   Recently found to have diabetic retina scan by NP from house calls - no DR but found to have macular drusen - rec eye doctor f/u. Referral placed to ophthalmology in [place], still pending scheduling.   DM - does regularly check sugars 90-120s, isolated high to 162 after papaya smoothie. Compliant with antihyperglycemic regimen which includes: glipizide  XL 10mg  twice daily, metformin  1000mg  bid. Denies low sugars or hypoglycemic symptoms. Denies paresthesias, blurry vision. Last diabetic eye exam through house calls last week - no DR. Glucometer brand: unsure. Last foot exam: 08/2023. DSME: remotely. Did not tolerate ozempic  - now off this. Did not tolerate jardiance .  Lab Results  Component Value Date   HGBA1C 7.5 (A) 04/04/2024   Diabetic Foot Exam - Simple   Simple Foot Form Diabetic Foot exam was performed with the following findings: Yes 04/04/2024 12:44 PM  Visual Inspection No deformities, no ulcerations, no other skin breakdown bilaterally: Yes Sensation Testing Intact to touch and monofilament testing bilaterally: Yes Pulse Check Posterior Tibialis and Dorsalis pulse intact bilaterally: Yes Comments No claudication  Maceration between right 4th/5th digits    Lab Results  Component Value Date   MICROALBUR 4.9 (H) 09/08/2023   He has received 1 shingrix shot through the pharmacy - encouraged he schedule 2nd vaccine.       Relevant past medical, surgical, family and social history reviewed and updated as indicated. Interim medical history since our last visit reviewed. Allergies and medications reviewed and updated. Outpatient Medications Prior to Visit  Medication Sig Dispense Refill   glipiZIDE  (GLUCOTROL  XL) 10 MG 24 hr tablet TAKE 1 TABLET BY MOUTH TWICE A DAY 180 tablet 0   levothyroxine  (SYNTHROID ) 50 MCG tablet TAKE 1 TABLET BY MOUTH EVERY DAY IN THE MORNING 90 tablet 1   metFORMIN  (GLUCOPHAGE ) 1000 MG tablet TAKE 1 TABLET BY MOUTH TWICE A DAY 180 tablet 1   metoprolol  succinate (TOPROL -XL) 25 MG 24 hr tablet TAKE 1 TABLET (25 MG TOTAL) BY MOUTH DAILY. 90 tablet 1   rivaroxaban  (XARELTO ) 20 MG TABS tablet Take 1 tablet (20 mg total) by mouth daily. 90 tablet 3   rosuvastatin  (CRESTOR ) 10 MG tablet TAKE 1 TABLET BY MOUTH EVERY DAY 90 tablet 3   sildenafil  (VIAGRA ) 50 MG tablet Take 0.5-1 tablets (25-50 mg total) by mouth daily as needed for erectile dysfunction. 10 tablet 0   triamcinolone  cream (KENALOG ) 0.1 % Apply 1 Application topically 2 (two) times daily as needed. 45 g 1   tamsulosin  (FLOMAX ) 0.4 MG CAPS capsule Take 1 capsule (0.4 mg total) by mouth daily. 90 capsule 1   No facility-administered medications prior to visit.     Per HPI unless specifically indicated in ROS section below Review of Systems  Objective:  BP 118/78   Pulse (!) 101  Temp 97.9 F (36.6 C) (Oral)   Ht 5' 3.75 (1.619 m)   Wt 172 lb 6 oz (78.2 kg)   SpO2 97%   BMI 29.82 kg/m   Wt Readings from Last 3 Encounters:  04/04/24 172 lb 6 oz (78.2 kg)  03/01/24 175 lb 2 oz (79.4 kg)  12/29/23 177 lb (80.3 kg)      Physical Exam Vitals and nursing note reviewed.  Constitutional:      Appearance: Normal appearance. He is not ill-appearing.  HENT:     Mouth/Throat:     Mouth: Mucous membranes are moist.     Pharynx: Oropharynx is clear. No oropharyngeal exudate or posterior oropharyngeal erythema.  Eyes:      Extraocular Movements: Extraocular movements intact.     Conjunctiva/sclera: Conjunctivae normal.     Pupils: Pupils are equal, round, and reactive to light.     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 Rhythm irregularly irregular.     Pulses: Normal pulses.     Heart sounds: Normal heart sounds. No murmur heard. Pulmonary:     Effort: Pulmonary effort is normal. No respiratory distress.     Breath sounds: Normal breath sounds. No wheezing, rhonchi or rales.  Musculoskeletal:     Right lower leg: No edema.     Left lower leg: No edema.     Comments: See HPI for foot exam if done  Skin:    General: Skin is warm and dry.     Findings: No rash.  Neurological:     Mental Status: He is alert.  Psychiatric:        Mood and Affect: Mood normal.        Behavior: Behavior normal.       Results for orders placed or performed in visit on 04/04/24  POCT glycosylated hemoglobin (Hb A1C)   Collection Time: 04/04/24 12:24 PM  Result Value Ref Range   Hemoglobin A1C 7.5 (A) 4.0 - 5.6 %   HbA1c POC (<> result, manual entry)     HbA1c, POC (prediabetic range)     HbA1c, POC (controlled diabetic range)     Lab Results  Component Value Date   NA 140 09/08/2023   CL 105 09/08/2023   K 5.0 09/08/2023   CO2 28 09/08/2023   BUN 15 09/08/2023   CREATININE 0.76 09/08/2023   GFR 93.08 09/08/2023   CALCIUM  9.5 09/08/2023   ALBUMIN 4.5 09/08/2023   GLUCOSE 169 (H) 09/08/2023   Lab Results  Component Value Date   TSH 3.90 09/08/2023    Lab Results  Component Value Date   CHOL 121 09/08/2023   HDL 54.90 09/08/2023   LDLCALC 44 09/08/2023   TRIG 108.0 09/08/2023   CHOLHDL 2 09/08/2023    Assessment & Plan:   Problem List Items Addressed This Visit     Longstanding persistent atrial fibrillation (HCC)   Chronic, stable period on Toprol  XL and eliquis.       Type 2 diabetes mellitus with other specified complication (HCC) - Primary    Chronic, stable period - continue current regimen. H/o trouble tolerating jardiance  and ozempic .  Will await diabetic retinopathy screening exam done by [facility] - he will drop off report to update chart.  Foot exam today. Reviewed goal glycemic ranges.  Provided with 15-15 hypoglycemia rule.  Provided with diabetes and nutrition handout.       Relevant Orders   POCT glycosylated hemoglobin (  Hb A1C) (Completed)   Hypertension   Chronic, stable on metoprolol . Consider ACEI/ARB.       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 Discussed possible indication of macular degeneration - pending ophthalmology eval, referral placed last week.       Other Visit Diagnoses       Encounter for immunization       Relevant Orders   Flu vaccine HIGH DOSE PF(Fluzone Trivalent) (Completed)        Meds ordered this encounter  Medications   Blood Glucose Monitoring Suppl DEVI    Sig: 1 each by Does not apply route in the morning, at noon, and at bedtime. May substitute to any manufacturer covered by patient's insurance.    Dispense:  1 each    Refill:  0   Glucose Blood (BLOOD GLUCOSE TEST STRIPS) STRP    Sig: 1 each by Does not apply route in the morning, at noon, and at bedtime. May substitute to any manufacturer covered by patient's insurance.    Dispense:  100 strip    Refill:  0   Lancet Device MISC    Sig: 1 each by Does not apply route in the morning, at noon, and at bedtime. May substitute to any manufacturer covered by patient's insurance.    Dispense:  1 each    Refill:  0   Lancets Misc. MISC    Sig: 1 each by Does not apply route in the morning, at noon, and at bedtime. May substitute to any manufacturer covered by patient's insurance.    Dispense:  100 each    Refill:  0    tamsulosin  (FLOMAX ) 0.4 MG CAPS capsule    Sig: Take 1 capsule (0.4 mg total) by mouth daily.    Dispense:  90 capsule    Refill:  1    Orders Placed This Encounter  Procedures   Flu vaccine HIGH DOSE PF(Fluzone Trivalent)   POCT glycosylated hemoglobin (Hb A1C)    Patient Instructions  Flu shot today  Puede usar crema topical clotrmiazole (lotrimin) para hongo en la piel de los pies (entre los dedos) - se puede comprar sin receta.   Azucar va mejorando - siga trabajando en la dieta baja en azucar y carbohidratos. Le he imprimido informe sobre esto.  Le he mandado nueva receta para glucometro con test strips a su farmacia.   Ideal azucar en ayunas: 80-120  Ideal azucar 2 horas despues de comer: <180  Gusto verlo hoy  Regresar en 3 meses para proxima visita control de diabetes.    La regla 15-15 para tratar azcares bajas: Si el nivel de azcar est por debajo de 70, tome 15 gramos de carbohidratos para elevar su nivel de azcar en la sangre y revise azucar de nuevo despus de 15 minutos. Si todava est por debajo de 70 mg/dL, tome otra porcin de 15 gramos.  15 gramos de carbohidratos pueden ser: -Pastillas de glucosa (ver instrucciones) -Tubo de gel (ver instrucciones) -4 onzas (1/2 taza) de jugo o refresco regular (no de dieta) -1 cucharada de azcar, miel o jarabe de maz -Caramelos duros, gominolas o gominolas: consulte la etiqueta de los alimentos para saber cuntos consumir  Repita estos pasos hasta que su nivel de azcar en la  sangre sea al menos 70 mg/dL. Una vez que su nivel de azcar en la sangre vuelva a la normalidad, coma una comida o un tentempi para asegurarse de que no vuelva a Publishing copy.   Follow up plan: Return in about 3 months (around 07/05/2024) for follow up visit.  Anton Blas, MD

## 2024-04-04 NOTE — Assessment & Plan Note (Signed)
 Continue full dose xarelto 

## 2024-04-04 NOTE — Assessment & Plan Note (Addendum)
 Noted on house calls retina scan for DR screening. Discussed possible indication of macular degeneration - pending ophthalmology eval, referral placed last week.

## 2024-04-04 NOTE — Assessment & Plan Note (Signed)
 Chronic, stable on metoprolol . Consider ACEI/ARB.

## 2024-04-04 NOTE — Assessment & Plan Note (Signed)
 Chronic, stable period on Toprol  XL and eliquis.

## 2024-04-04 NOTE — Assessment & Plan Note (Addendum)
 Chronic, stable period - continue current regimen. H/o trouble tolerating jardiance  and ozempic .  Will await diabetic retinopathy screening exam done by [facility] - he will drop off report to update chart.  Foot exam today. Reviewed goal glycemic ranges.  Provided with 15-15 hypoglycemia rule.  Provided with diabetes and nutrition handout.

## 2024-04-04 NOTE — Patient Instructions (Addendum)
 Flu shot today  Puede usar crema topical clotrmiazole (lotrimin) para hongo en la piel de los pies (entre los dedos) - se puede comprar sin receta.   Azucar va mejorando - siga trabajando en la dieta baja en azucar y carbohidratos. Le he imprimido informe sobre esto.  Le he mandado nueva receta para glucometro con test strips a su farmacia.   Ideal azucar en ayunas: 80-120  Ideal azucar 2 horas despues de comer: <180  Gusto verlo hoy  Regresar en 3 meses para proxima visita control de diabetes.    La regla 15-15 para tratar azcares bajas: Si el nivel de azcar est por debajo de 70, tome 15 gramos de carbohidratos para elevar su nivel de azcar en la sangre y revise azucar de nuevo despus de 15 minutos. Si todava est por debajo de 70 mg/dL, tome otra porcin de 15 gramos.  15 gramos de carbohidratos pueden ser: -Pastillas de glucosa (ver instrucciones) -Tubo de gel (ver instrucciones) -4 onzas (1/2 taza) de jugo o refresco regular (no de dieta) -1 cucharada de azcar, miel o jarabe de maz -Caramelos duros, gominolas o gominolas: consulte la etiqueta de los alimentos para saber cuntos consumir  Repita estos pasos hasta que su nivel de azcar en la sangre sea al menos 70 mg/dL. Una vez que su nivel de azcar en la sangre vuelva a la normalidad, coma una comida o un tentempi para asegurarse de que no vuelva a Publishing copy.

## 2024-04-14 ENCOUNTER — Encounter: Payer: Self-pay | Admitting: Family Medicine

## 2024-05-08 ENCOUNTER — Ambulatory Visit: Payer: Self-pay | Admitting: Family Medicine

## 2024-05-22 ENCOUNTER — Encounter: Payer: Self-pay | Admitting: *Deleted

## 2024-06-02 ENCOUNTER — Other Ambulatory Visit: Payer: Self-pay | Admitting: Family Medicine

## 2024-06-02 DIAGNOSIS — E669 Obesity, unspecified: Secondary | ICD-10-CM

## 2024-06-23 ENCOUNTER — Other Ambulatory Visit: Payer: Self-pay | Admitting: Family Medicine

## 2024-06-23 DIAGNOSIS — E669 Obesity, unspecified: Secondary | ICD-10-CM

## 2024-06-23 DIAGNOSIS — I152 Hypertension secondary to endocrine disorders: Secondary | ICD-10-CM

## 2024-06-23 DIAGNOSIS — I480 Paroxysmal atrial fibrillation: Secondary | ICD-10-CM

## 2024-06-23 NOTE — Telephone Encounter (Signed)
 Copied from CRM #[id number]. Topic: Clinical - Medication Refill >> Jun 23, 2024 11:03 AM Ashley R wrote: Medication:  rosuvastatin  (CRESTOR ) 10 MG tablet metoprolol  succinate (TOPROL -XL) 25 MG 24 hr tablet   Has the patient contacted their pharmacy? Yes  This is the patient's preferred pharmacy:  [id number] [place], [place] - [address] [place] [address] Phone: [phone] Fax: [phone]  Is this the correct pharmacy for this prescription? Yes If no, delete pharmacy and type the correct one.   Has the prescription been filled recently? Yes  Is the patient out of the medication? Yes  Has the patient been seen for an appointment in the last year OR does the patient have an upcoming appointment? Yes  Can we respond through MyChart? Yes  Agent: Please be advised that Rx refills may take up to 3 business days. We ask that you follow-up with your pharmacy.

## 2024-06-26 MED ORDER — METOPROLOL SUCCINATE ER 25 MG PO TB24: 25.0000 mg | ORAL_TABLET | Freq: Every day | ORAL | 0 refills | Status: AC

## 2024-07-04 NOTE — Progress Notes (Signed)
 Donald Chang                                          MRN: [id number]   07/04/2024   The [facility] Quality Team Specialist reviewed this patient medical record for the purposes of chart review for care gap closure. The following were reviewed: abstraction for care gap closure-glycemic status assessment.    [facility] Quality Team

## 2024-07-05 ENCOUNTER — Encounter (HOSPITAL_BASED_OUTPATIENT_CLINIC_OR_DEPARTMENT_OTHER): Payer: Self-pay

## 2024-07-05 ENCOUNTER — Emergency Department (HOSPITAL_BASED_OUTPATIENT_CLINIC_OR_DEPARTMENT_OTHER)
Admission: EM | Admit: 2024-07-05 | Discharge: 2024-07-06 | Disposition: A | Discharge status: Home / Self Care | Attending: Emergency Medicine | Admitting: Emergency Medicine

## 2024-07-05 ENCOUNTER — Emergency Department (HOSPITAL_BASED_OUTPATIENT_CLINIC_OR_DEPARTMENT_OTHER)

## 2024-07-05 ENCOUNTER — Ambulatory Visit: Admitting: Family Medicine

## 2024-07-05 DIAGNOSIS — Z7901 Long term (current) use of anticoagulants: Secondary | ICD-10-CM | POA: Diagnosis not present

## 2024-07-05 DIAGNOSIS — R519 Headache, unspecified: Secondary | ICD-10-CM | POA: Diagnosis present

## 2024-07-05 LAB — COMPREHENSIVE METABOLIC PANEL WITH GFR
ALT: 27 U/L (ref 0–44)
AST: 26 U/L (ref 15–41)
Albumin: 4.5 g/dL (ref 3.5–5.0)
Alkaline Phosphatase: 113 U/L (ref 38–126)
Anion gap: 11 (ref 5–15)
BUN: 22 mg/dL (ref 8–23)
CO2: 27 mmol/L (ref 22–32)
Calcium: 9.8 mg/dL (ref 8.9–10.3)
Chloride: 100 mmol/L (ref 98–111)
Creatinine, Ser: 0.8 mg/dL (ref 0.61–1.24)
GFR, Estimated: >60 mL/min
Glucose, Bld: 246 mg/dL — ABNORMAL HIGH (ref 70–99)
Potassium: 3.9 mmol/L (ref 3.5–5.1)
Sodium: 137 mmol/L (ref 135–145)
Total Bilirubin: 0.4 mg/dL (ref 0.0–1.2)
Total Protein: 7.9 g/dL (ref 6.5–8.1)

## 2024-07-05 LAB — CBC WITH DIFFERENTIAL/PLATELET
Abs Immature Granulocytes: 0.01 K/uL (ref 0.00–0.07)
Basophils Absolute: 0.1 K/uL (ref 0.0–0.1)
Basophils Relative: 1 %
Eosinophils Absolute: 0.4 K/uL (ref 0.0–0.5)
Eosinophils Relative: 7 %
HCT: 41.2 % (ref 39.0–52.0)
Hemoglobin: 13.8 g/dL (ref 13.0–17.0)
Immature Granulocytes: 0 %
Lymphocytes Relative: 27 %
Lymphs Abs: 1.6 K/uL (ref 0.7–4.0)
MCH: 27.1 pg (ref 26.0–34.0)
MCHC: 33.5 g/dL (ref 30.0–36.0)
MCV: 80.8 fL (ref 80.0–100.0)
Monocytes Absolute: 0.5 K/uL (ref 0.1–1.0)
Monocytes Relative: 9 %
Neutro Abs: 3.3 K/uL (ref 1.7–7.7)
Neutrophils Relative %: 56 %
Platelets: 252 K/uL (ref 150–400)
RBC: 5.1 MIL/uL (ref 4.22–5.81)
RDW: 13.2 % (ref 11.5–15.5)
WBC: 5.9 K/uL (ref 4.0–10.5)
nRBC: 0 % (ref 0.0–0.2)

## 2024-07-05 NOTE — ED Triage Notes (Signed)
 Pt c/o HA x3 days. Denies NV, blurred vision, weakness, focal deficit.  T[facility], my sister said it's really high, go to ER.  On amox for ear infection

## 2024-07-06 ENCOUNTER — Emergency Department (HOSPITAL_BASED_OUTPATIENT_CLINIC_OR_DEPARTMENT_OTHER)

## 2024-07-06 MED ORDER — IOHEXOL 300 MG/ML  SOLN
100.0000 mL | Freq: Once | INTRAMUSCULAR | Status: AC | PRN
  Administered 2024-07-06: 100 mL via INTRAVENOUS

## 2024-07-06 MED ORDER — LACTATED RINGERS IV BOLUS
1000.0000 mL | Freq: Once | INTRAVENOUS | Status: AC
  Administered 2024-07-06: 1000 mL via INTRAVENOUS

## 2024-07-06 MED ORDER — METOCLOPRAMIDE HCL 5 MG/ML IJ SOLN
10.0000 mg | Freq: Once | INTRAMUSCULAR | Status: AC
  Administered 2024-07-06: 10 mg via INTRAVENOUS
  Filled 2024-07-06: qty 2

## 2024-07-06 MED ORDER — DEXAMETHASONE SOD PHOSPHATE PF 10 MG/ML IJ SOLN
10.0000 mg | Freq: Once | INTRAMUSCULAR | Status: AC
  Administered 2024-07-06: 10 mg via INTRAVENOUS
  Filled 2024-07-06: qty 1

## 2024-07-06 MED ORDER — DIPHENHYDRAMINE HCL 50 MG/ML IJ SOLN
25.0000 mg | Freq: Once | INTRAMUSCULAR | Status: AC
  Administered 2024-07-06: 25 mg via INTRAVENOUS
  Filled 2024-07-06: qty 1

## 2024-07-06 NOTE — ED Provider Notes (Signed)
 " [facility]ERGENC[facility] DEPARTMENT AT [facility] Provider Note   CSN: [id number] Arrival date & time: 07/05/24  2154     Patient presents with: Headache   Donald Chang is a 69 y.o. male.   Patient states that his blood pressure was slightly high and a low bit of a headache for few days.  He states he checked his blood pressure at the pharmacy and it was elevated but he does not know what the numbers were.  States he talked to a family member who stated he needed to go to the ER immediately.  Patient has no chest pain, shortness of breath, back pain,, neurologic symptoms or any other acute symptoms.   Headache      Prior to Admission medications  Medication Sig Start Date End Date Taking? Authorizing Provider  Blood Glucose Monitoring Suppl DEVI 1 each by Does not apply route in the morning, at noon, and at bedtime. May substitute to any manufacturer covered by patient's insurance. 04/04/24   Rilla Baller, MD  glipiZIDE  (GLUCOTROL  XL) 10 MG 24 hr tablet TAKE 1 TABLET BY MOUTH TWICE A DAY 06/02/24   Rilla Baller, MD  levothyroxine  (SYNTHROID ) 50 MCG tablet TAKE 1 TABLET BY MOUTH EVERY DAY IN THE MORNING 02/07/24   Wendee Lynwood HERO, NP  metFORMIN  (GLUCOPHAGE ) 1000 MG tablet TAKE 1 TABLET BY MOUTH TWICE A DAY 12/22/23   Wendee Lynwood HERO, NP  metoprolol  succinate (TOPROL -XL) 25 MG 24 hr tablet Take 1 tablet (25 mg total) by mouth daily. 06/26/24   Rilla Baller, MD  rivaroxaban  (XARELTO ) 20 MG TABS tablet Take 1 tablet (20 mg total) by mouth daily. 09/23/23   Wendee Lynwood HERO, NP  rosuvastatin  (CRESTOR ) 10 MG tablet TAKE 1 TABLET BY MOUTH EVERY DAY 12/22/23   Wendee Lynwood HERO, NP  sildenafil  (VIAGRA ) 50 MG tablet Take 0.5-1 tablets (25-50 mg total) by mouth daily as needed for erectile dysfunction. 09/08/23   Wendee Lynwood HERO, NP  tamsulosin  (FLOMAX ) 0.4 MG CAPS capsule Take 1 capsule (0.4 mg total) by mouth daily. 04/04/24   Rilla Baller, MD  triamcinolone  cream (KENALOG ) 0.1 %  Apply 1 Application topically 2 (two) times daily as needed. 03/18/23   Jimmy Charlie FERNS, MD    Allergies: Patient has no known allergies.    Review of Systems  Neurological:  Positive for headaches.    Updated Vital Signs BP (!) 140/74   Pulse (!) 114   Temp 97.9 F (36.6 C) (Oral)   Resp 15   SpO2 99%   Physical Exam Vitals and nursing note reviewed.  Constitutional:      Appearance: He is well-developed.  HENT:     Head: Normocephalic and atraumatic.  Cardiovascular:     Rate and Rhythm: Normal rate.  Pulmonary:     Effort: Pulmonary effort is normal. No respiratory distress.  Abdominal:     General: There is no distension.  Musculoskeletal:        General: Normal range of motion.     Cervical back: Normal range of motion.  Neurological:     Mental Status: He is alert.     Comments: No altered mental status, able to give full seemingly accurate history.  Face is symmetric, EOM's intact, pupils equal and reactive, vision intact, tongue and uvula midline without deviation, hearing not noticeably abnormal. Taste and smell not tested. Upper and Lower extremity motor 5/5, intact pain perception in distal extremities, 2+ reflexes in biceps, patella and achilles tendons. Able  to perform finger to nose normal with both hands. Walks without assistance or evident ataxia.       (all labs ordered are listed, but only abnormal results are displayed) Labs Reviewed  COMPREHENSIVE METABOLIC PANEL WITH GFR - Abnormal; Notable for the following components:      Result Value   Glucose, Bld 246 (*)    All other components within normal limits  CBC WITH DIFFERENTIAL/PLATELET    EKG: None  Radiology: CT Temporal Bones W Contrast Result Date: 07/06/2024 EXAM: CT TEMPORAL BONES WITH CONTRAST 07/06/2024 03:11:05 AM TECHNIQUE: CT of the temporal bones was performed with the administration of intravenous contrast. Multiplanar reformatted images are provided for review. Automated  exposure control, iterative reconstruction, and/or weight based adjustment of the mA/kV was utilized to reduce the radiation dose to as low as reasonably achievable. COMPARISON: None available. CLINICAL HISTORY: Evaluate for e/o AOM spread FINDINGS: RIGHT: EXTERNAL AUDITORY CANAL: Clear. No bony erosion. Scutum is intact. MIDDLE EAR CAVITY: Partially opacified. Ossicular chain is intact. MASTOID AIR CELLS: Large mastoid effusion. INNER EAR: The cochlea and vestibule are unremarkable. Normal mineralization of the otic capsule. Normal semicircular canals. The vestibular aqueduct is not dilated. INTERNAL AUDITORY CANAL: Unremarkable. Normal bony canal of the facial nerve. LEFT: EXTERNAL AUDITORY CANAL: Clear. No bony erosion. Scutum is intact. MIDDLE EAR CAVITY: Partially opacified. Ossicular chain is intact. MASTOID AIR CELLS: Large mastoid effusion. INNER EAR: The cochlea and vestibule are unremarkable. Normal mineralization of the otic capsule. Normal semicircular canals. The vestibular aqueduct is not dilated. INTERNAL AUDITORY CANAL: Unremarkable. Normal bony canal of the facial nerve. VASCULAR: Normal jugular bulbs. Normal carotid canals. BRAIN: Unremarkable. ORBITS: No acute abnormality. SINUSES: Mild mucosal thickening. IMPRESSION: 1. Bilateral mastoid effusions and middle ear fluid, which could represent otomastoiditis. No complicating features. Electronically signed by: Gilmore Molt MD 07/06/2024 03:43 AM EST RP Workstation: [id number]   CT Soft Tissue Neck W Contrast Result Date: 07/06/2024 EXAM: CT NECK WITH CONTRAST 07/06/2024 03:11:05 AM TECHNIQUE: CT of the neck was performed with the administration of intravenous contrast. Multiplanar reformatted images are provided for review. Automated exposure control, iterative reconstruction, and/or weight based adjustment of the mA/kV was utilized to reduce the radiation dose to as low as reasonably achievable. COMPARISON: None available. CLINICAL HISTORY:  Evaluate for e/o Lemierre syndrome FINDINGS: AERODIGESTIVE TRACT: No discrete mass. No edema. Streak artifact limits assessment of oropharynx. SALIVARY GLANDS: The parotid and submandibular glands are unremarkable. THYROID : Unremarkable. LYMPH NODES: No suspicious cervical lymphadenopathy. SOFT TISSUES: No mass or fluid collection. BONES: No acute abnormality. Lower cervical degenerative disc disease. OTHER: Visualized sinuses and mastoid air cells are well aerated. Visualized lungs are clear. Internal jugular veins are patent. IMPRESSION: 1. No significant abnormality. Electronically signed by: Gilmore Molt MD 07/06/2024 03:30 AM EST RP Workstation: [id number]   CT Head Wo Contrast Result Date: 07/05/2024 CLINICAL DATA:  Headache EXAM: CT HEAD WITHOUT CONTRAST TECHNIQUE: Contiguous axial images were obtained from the base of the skull through the vertex without intravenous contrast. RADIATION DOSE REDUCTION: This exam was performed according to the departmental dose-optimization program which includes automated exposure control, adjustment of the mA and/or kV according to patient size and/or use of iterative reconstruction technique. COMPARISON:  None Available. FINDINGS: Brain: No acute territorial infarction, hemorrhage or intracranial mass. The ventricles are nonenlarged Vascular: No hyperdense vessels.  No unexpected calcification Skull: Normal. Negative for fracture or focal lesion. Sinuses/Orbits: No acute finding. Other: None IMPRESSION: Negative non contrasted CT appearance of the brain. Electronically  Signed   By: Luke Bun M.D.   On: 07/05/2024 23:53     Procedures   Medications Ordered in the ED  metoCLOPramide  (REGLAN ) injection 10 mg (10 mg Intravenous Given 07/06/24 0235)  diphenhydrAMINE  (BENADRYL ) injection 25 mg (25 mg Intravenous Given 07/06/24 0236)  dexamethasone  (DECADRON ) injection 10 mg (10 mg Intravenous Given 07/06/24 0235)  lactated ringers  bolus 1,000 mL (0 mLs Intravenous  Stopped 07/06/24 0405)  iohexol  (OMNIPAQUE ) 300 MG/ML solution 100 mL (100 mLs Intravenous Contrast Given 07/06/24 0253)                                    Medical Decision Making Amount and/or Complexity of Data Reviewed Labs: ordered. Radiology: ordered.  Risk Prescription drug management.   Headache cocktail seemed to resolve his headache.  Blood pressure is not significantly elevated here.  Secondary to this and there is report of tinnitus and recent ear infection CT scans were done to make sure there is no extension of the ear infection intracranially but also to make sure there is no evidence of Lemierre's and these were negative.  Patient overall appears well.  Suggested PCP follow-up for further management of his blood pressures and headaches.  Final diagnoses:  Nonintractable headache, unspecified chronicity pattern, unspecified headache type    ED Discharge Orders     None          Shavon Zenz, Selinda, MD 07/06/24 0732  "

## 2024-07-07 ENCOUNTER — Telehealth: Payer: Self-pay

## 2024-07-07 NOTE — Telephone Encounter (Signed)
 Will discuss at OV next week.  This is an insurance question.  Anticipate higher cost until he meets $2000/yr annual OOP drug cap.

## 2024-07-07 NOTE — Telephone Encounter (Signed)
 Copied from CRM #[id number]. Topic: Clinical - Medication Question >> Jul 06, 2024  1:49 PM Donald Chang wrote: Reason for CRM: Patient stated he was charged 400-450 for rivaroxaban  (XARELTO ) 20 MG TABS tablet normally charge 105 for one pen and would like to know why. Please call   ----------------------------------------------------------------------- From previous Reason for Contact - Prescription Issue: Reason for CRM:

## 2024-07-12 ENCOUNTER — Inpatient Hospital Stay: Admitting: Family Medicine

## 2024-07-12 NOTE — Telephone Encounter (Signed)
 Please call patient - he missed appt 07/06/2023 and again today 07/13/2023.  Please reschedule f/u appt.   Regarding below question on xarelto  - anticipate higher cost in 2026 until he meets $2000/yr annual OOP drug cap.

## 2024-07-12 NOTE — Telephone Encounter (Signed)
 Unable to reach pt at this time, unable to leave voicemail on mobile or home phone  Please provide message from provider/office when call is returned from patient. Please reschedule visit

## 2024-07-20 ENCOUNTER — Other Ambulatory Visit: Payer: Self-pay | Admitting: Nurse Practitioner

## 2024-07-20 DIAGNOSIS — E669 Obesity, unspecified: Secondary | ICD-10-CM

## 2024-07-22 NOTE — Telephone Encounter (Signed)
 ERx

## 2024-07-25 ENCOUNTER — Ambulatory Visit: Admitting: Family Medicine

## 2024-07-26 ENCOUNTER — Ambulatory Visit: Admitting: Family Medicine

## 2024-07-26 ENCOUNTER — Encounter: Payer: Self-pay | Admitting: Family Medicine

## 2024-07-26 VITALS — BP 136/88 | HR 80 | Temp 98.5°F | Ht 63.75 in | Wt 176.0 lb

## 2024-07-26 DIAGNOSIS — E1169 Type 2 diabetes mellitus with other specified complication: Secondary | ICD-10-CM | POA: Diagnosis not present

## 2024-07-26 DIAGNOSIS — D6869 Other thrombophilia: Secondary | ICD-10-CM

## 2024-07-26 DIAGNOSIS — I10 Essential (primary) hypertension: Secondary | ICD-10-CM | POA: Diagnosis not present

## 2024-07-26 DIAGNOSIS — Z7984 Long term (current) use of oral hypoglycemic drugs: Secondary | ICD-10-CM | POA: Diagnosis not present

## 2024-07-26 DIAGNOSIS — I4811 Longstanding persistent atrial fibrillation: Secondary | ICD-10-CM | POA: Diagnosis not present

## 2024-07-26 LAB — POCT GLYCOSYLATED HEMOGLOBIN (HGB A1C): Hemoglobin A1C: 8.2 % — AB (ref 4.0–5.6)

## 2024-07-26 MED ORDER — TRIAMCINOLONE ACETONIDE 0.1 % EX CREA: 1.0000 | TOPICAL_CREAM | Freq: Two times a day (BID) | CUTANEOUS | 1 refills | Status: AC | PRN

## 2024-07-26 MED ORDER — FREESTYLE LITE TEST VI STRP: ORAL_STRIP | 12 refills | Status: AC

## 2024-07-26 NOTE — Assessment & Plan Note (Signed)
 Chronic, BP stable only on toprol  XL 25mg  - consider adding ACEI/ARB for kidney protection

## 2024-07-26 NOTE — Assessment & Plan Note (Addendum)
 Chronic, deteriorated control in setting of christmas season  Saw nutritionist remotely. Refer to nutrition for 2 hour refresher course  Continue current medicines- full dose metformin  + glipizide  XL 10mg  bid. He notes ozempic  caused trouble with nausea Pending DR screen eye exam with Dr Octavia 08/2024. Diabetes associated with HTN, HLD, atrial fibrillation, obesity.

## 2024-07-26 NOTE — Progress Notes (Signed)
 " Ph: [phone] Fax: [phone]   Patient ID: Donald Chang, male    DOB: 09/19/1955, 69 y.o.   MRN: [id number]  This visit was conducted in person.  BP 136/88   Pulse 80   Temp 98.5 F (36.9 C) (Oral)   Ht 5' 3.75 (1.619 m)   Wt 176 lb (79.8 kg)   SpO2 99%   BMI 30.45 kg/m    CC: DM f/u visit  Subjective:   HPI: Donald Chang is a 69 y.o. male presenting on 07/26/2024 for Medical Management of Chronic Issues and Ear Fullness (B/l ear fullness )   RN daughter lives in [place] Other daughter lives in [place]  Last CPE 08/2023 He has not been using mychart - asks for assistance with this - will de-active until he's ready to reactivate. He will ask son for assistance.   URI symptoms earlier this month treated with augmentin course. Notes persistent fullness to ears /tinnitus.   Notes significant increased price of Xarelto  - up to $400/72mo supply. Discussed likely reason (OOP up front $2100/yr cost to patients). Takes this for atrial fibrillation along with Toprol  XL - continue.   DM - does regularly check sugars 112 fasting yesterday. Compliant with antihyperglycemic regimen which includes: metformin  1000mg  bid, glipizide  XL 10mg  BID. Denies low sugars or hypoglycemic symptoms. Denies paresthesias, blurry vision. Last diabetic eye exam DUE - has appt 08/2024. Glucometer brand: unsure . Last foot exam: 03/2024. DSME: remotely. Did not tolerate ozempic  (nausea) or jardiance .  Lab Results  Component Value Date   HGBA1C 8.2 (A) 07/26/2024  Prior A1c 7.5 - 03/2024 Diabetic Foot Exam - Simple   No data filed    Lab Results  Component Value Date   MICROALBUR 4.9 (H) 09/08/2023         Relevant past medical, surgical, family and social history reviewed and updated as indicated. Interim medical history since our last visit reviewed. Allergies and medications reviewed and updated. Outpatient Medications Prior to Visit  Medication Sig Dispense Refill   Blood  Glucose Monitoring Suppl DEVI 1 each by Does not apply route in the morning, at noon, and at bedtime. May substitute to any manufacturer covered by patient's insurance. 1 each 0   glipiZIDE  (GLUCOTROL  XL) 10 MG 24 hr tablet TAKE 1 TABLET BY MOUTH TWICE A DAY 180 tablet 0   levothyroxine  (SYNTHROID ) 50 MCG tablet TAKE 1 TABLET BY MOUTH EVERY DAY IN THE MORNING 90 tablet 1   metFORMIN  (GLUCOPHAGE ) 1000 MG tablet TAKE 1 TABLET BY MOUTH TWICE A DAY 180 tablet 0   metoprolol  succinate (TOPROL -XL) 25 MG 24 hr tablet Take 1 tablet (25 mg total) by mouth daily. 90 tablet 0   rivaroxaban  (XARELTO ) 20 MG TABS tablet Take 1 tablet (20 mg total) by mouth daily. 90 tablet 3   rosuvastatin  (CRESTOR ) 10 MG tablet TAKE 1 TABLET BY MOUTH EVERY DAY 90 tablet 3   sildenafil  (VIAGRA ) 50 MG tablet Take 0.5-1 tablets (25-50 mg total) by mouth daily as needed for erectile dysfunction. 10 tablet 0   tamsulosin  (FLOMAX ) 0.4 MG CAPS capsule Take 1 capsule (0.4 mg total) by mouth daily. 90 capsule 1   triamcinolone  cream (KENALOG ) 0.1 % Apply 1 Application topically 2 (two) times daily as needed. 45 g 1   No facility-administered medications prior to visit.     Per HPI unless specifically indicated in ROS section below Review of Systems  Objective:  BP 136/88   Pulse 80   Temp  98.5 F (36.9 C) (Oral)   Ht 5' 3.75 (1.619 m)   Wt 176 lb (79.8 kg)   SpO2 99%   BMI 30.45 kg/m   Wt Readings from Last 3 Encounters:  07/26/24 176 lb (79.8 kg)  04/04/24 172 lb 6 oz (78.2 kg)  03/01/24 175 lb 2 oz (79.4 kg)      Physical Exam Vitals and nursing note reviewed.  Constitutional:      Appearance: Normal appearance. He is not ill-appearing.  HENT:     Head: Normocephalic and atraumatic.     Right Ear: Tympanic membrane, ear canal and external ear normal. There is no impacted cerumen.     Left Ear: Tympanic membrane, ear canal and external ear normal. There is no impacted cerumen.     Mouth/Throat:     Mouth:  Mucous membranes are moist.  Eyes:     Extraocular Movements: Extraocular movements intact.     Conjunctiva/sclera: Conjunctivae normal.     Pupils: Pupils are equal, round, and reactive to light.  Cardiovascular:     Rate and Rhythm: Normal rate. Rhythm irregularly irregular.     Pulses: Normal pulses.     Heart sounds: Normal heart sounds. No murmur heard. Pulmonary:     Effort: Pulmonary effort is normal. No respiratory distress.     Breath sounds: Normal breath sounds. No wheezing, rhonchi or rales.  Musculoskeletal:     Right lower leg: No edema.     Left lower leg: No edema.     Comments: See HPI for foot exam if done  Skin:    General: Skin is warm and dry.     Findings: No rash.  Neurological:     Mental Status: He is alert.  Psychiatric:        Mood and Affect: Mood normal.        Behavior: Behavior normal.       Results for orders placed or performed in visit on 07/26/24  POCT glycosylated hemoglobin (Hb A1C)   Collection Time: 07/26/24 12:48 PM  Result Value Ref Range   Hemoglobin A1C 8.2 (A) 4.0 - 5.6 %   HbA1c POC (<> result, manual entry)     HbA1c, POC (prediabetic range)     HbA1c, POC (controlled diabetic range)     Lab Results  Component Value Date   NA 137 07/05/2024   CL 100 07/05/2024   K 3.9 07/05/2024   CO2 27 07/05/2024   BUN 22 07/05/2024   CREATININE 0.80 07/05/2024   GFRNONAA >60 07/05/2024   CALCIUM  9.8 07/05/2024   ALBUMIN 4.5 07/05/2024   GLUCOSE 246 (H) 07/05/2024   Lab Results  Component Value Date   WBC 5.9 07/05/2024   HGB 13.8 07/05/2024   HCT 41.2 07/05/2024   MCV 80.8 07/05/2024   PLT 252 07/05/2024    Lab Results  Component Value Date   ALT 27 07/05/2024   AST 26 07/05/2024   ALKPHOS 113 07/05/2024   BILITOT 0.4 07/05/2024   Assessment & Plan:   Problem List Items Addressed This Visit     Longstanding persistent atrial fibrillation (HCC)   Chronic, rate controlled, continue xarelto  and Toprol  XL       Type 2  diabetes mellitus with other specified complication (HCC) - Primary   Chronic, deteriorated control in setting of christmas season  Saw nutritionist remotely. Refer to nutrition for 2 hour refresher course  Continue current medicines- full dose metformin  + glipizide  XL 10mg  bid. He  notes ozempic  caused trouble with nausea Pending DR screen eye exam with Dr Octavia 08/2024. Diabetes associated with HTN, HLD, atrial fibrillation, obesity.       Relevant Orders   POCT glycosylated hemoglobin (Hb A1C) (Completed)   Amb ref to Medical Nutrition Therapy-MNT   Hypertension   Chronic, BP stable only on toprol  XL 25mg  - consider adding ACEI/ARB for kidney protection      Hypercoagulability due to atrial fibrillation (HCC)     Meds ordered this encounter  Medications   glucose blood (FREESTYLE LITE) test strip    Sig: Test daily as instructed    Dispense:  100 each    Refill:  12   triamcinolone  cream (KENALOG ) 0.1 %    Sig: Apply 1 Application topically 2 (two) times daily as needed.    Dispense:  45 g    Refill:  1    Orders Placed This Encounter  Procedures   Amb ref to Medical Nutrition Therapy-MNT    Referral Priority:   Routine    Referral Type:   Consultation    Referral Reason:   Specialty Services Required    Requested Specialty:   Nutrition    Number of Visits Requested:   1   POCT glycosylated hemoglobin (Hb A1C)    Patient Instructions  Siga esfuerzos de engineer, manufacturing en la dieta.  Siga medcinas Lo remitire a nutricionista en [place] - traiga su esposa.  Regresar en 3 meses para fisico.  Follow up plan: Return in about 3 months (around 10/24/2024), or if symptoms worsen or fail to improve, for annual exam, prior fasting for blood work.  Anton Blas, MD   "

## 2024-07-26 NOTE — Assessment & Plan Note (Signed)
 Chronic, rate controlled, continue xarelto  and Toprol  XL

## 2024-07-26 NOTE — Patient Instructions (Addendum)
 Siga esfuerzos de marine scientist.  Siga medcinas Lo remitire a nutricionista en [place] - traiga su esposa.  Regresar en 3 meses para fisico.

## 2024-07-29 ENCOUNTER — Other Ambulatory Visit: Payer: Self-pay | Admitting: Nurse Practitioner

## 2024-07-29 DIAGNOSIS — E039 Hypothyroidism, unspecified: Secondary | ICD-10-CM

## 2024-07-31 ENCOUNTER — Encounter: Admitting: Dietician

## 2024-08-14 ENCOUNTER — Encounter: Admitting: Dietician
